# Patient Record
Sex: Male | Born: 1970
Health system: Southern US, Community
[De-identification: ages and names within clinical notes are randomized; demographics above are authoritative.]

## PROBLEM LIST (undated history)

## (undated) DIAGNOSIS — J449 Chronic obstructive pulmonary disease, unspecified: Secondary | ICD-10-CM

## (undated) DIAGNOSIS — J189 Pneumonia, unspecified organism: Secondary | ICD-10-CM

## (undated) HISTORY — PX: FINGER SURGERY: SHX640

---

## 2016-10-28 ENCOUNTER — Encounter (HOSPITAL_COMMUNITY): Payer: Self-pay | Admitting: Emergency Medicine

## 2016-10-28 ENCOUNTER — Emergency Department (HOSPITAL_COMMUNITY): Payer: Self-pay

## 2016-10-28 ENCOUNTER — Emergency Department (HOSPITAL_COMMUNITY)
Admission: EM | Admit: 2016-10-28 | Discharge: 2016-10-28 | Disposition: A | Discharge status: Home / Self Care | Payer: Self-pay | Attending: Emergency Medicine | Admitting: Emergency Medicine

## 2016-10-28 DIAGNOSIS — J439 Emphysema, unspecified: Secondary | ICD-10-CM | POA: Insufficient documentation

## 2016-10-28 DIAGNOSIS — J189 Pneumonia, unspecified organism: Secondary | ICD-10-CM | POA: Insufficient documentation

## 2016-10-28 DIAGNOSIS — R918 Other nonspecific abnormal finding of lung field: Secondary | ICD-10-CM | POA: Insufficient documentation

## 2016-10-28 DIAGNOSIS — F17210 Nicotine dependence, cigarettes, uncomplicated: Secondary | ICD-10-CM | POA: Insufficient documentation

## 2016-10-28 LAB — CBC WITH DIFFERENTIAL/PLATELET
Basophils Absolute: 0.1 10*3/uL (ref 0.0–0.1)
Basophils Relative: 1 %
EOS PCT: 4 %
Eosinophils Absolute: 0.2 10*3/uL (ref 0.0–0.7)
HEMATOCRIT: 43.3 % (ref 39.0–52.0)
Hemoglobin: 14.3 g/dL (ref 13.0–17.0)
Lymphocytes Relative: 13 %
Lymphs Abs: 0.8 10*3/uL (ref 0.7–4.0)
MCH: 23.2 pg — ABNORMAL LOW (ref 26.0–34.0)
MCHC: 33 g/dL (ref 30.0–36.0)
MCV: 70.3 fL — AB (ref 78.0–100.0)
Monocytes Absolute: 0.3 10*3/uL (ref 0.1–1.0)
Monocytes Relative: 5 %
NEUTROS PCT: 77 %
Neutro Abs: 4.4 10*3/uL (ref 1.7–7.7)
PLATELETS: 231 10*3/uL (ref 150–400)
RBC: 6.16 MIL/uL — AB (ref 4.22–5.81)
RDW: 15 % (ref 11.5–15.5)
WBC: 5.8 10*3/uL (ref 4.0–10.5)

## 2016-10-28 LAB — BASIC METABOLIC PANEL
Anion gap: 5 (ref 5–15)
BUN: 10 mg/dL (ref 6–20)
CHLORIDE: 107 mmol/L (ref 101–111)
CO2: 26 mmol/L (ref 22–32)
Calcium: 9.3 mg/dL (ref 8.9–10.3)
Creatinine, Ser: 1.23 mg/dL (ref 0.61–1.24)
GFR calc non Af Amer: >60 mL/min (ref >60)
Glucose, Bld: 71 mg/dL (ref 65–99)
POTASSIUM: 4.4 mmol/L (ref 3.5–5.1)
SODIUM: 138 mmol/L (ref 135–145)

## 2016-10-28 LAB — I-STAT CHEM 8, ED
BUN: 13 mg/dL (ref 6–20)
CHLORIDE: 104 mmol/L (ref 101–111)
Calcium, Ion: 1.16 mmol/L (ref 1.15–1.40)
Creatinine, Ser: 1.3 mg/dL — ABNORMAL HIGH (ref 0.61–1.24)
GLUCOSE: 77 mg/dL (ref 65–99)
HCT: 44 % (ref 39.0–52.0)
Hemoglobin: 15 g/dL (ref 13.0–17.0)
POTASSIUM: 4.4 mmol/L (ref 3.5–5.1)
Sodium: 140 mmol/L (ref 135–145)
TCO2: 27 mmol/L (ref 0–100)

## 2016-10-28 LAB — RAPID HIV SCREEN (HIV 1/2 AB+AG)
HIV 1/2 Antibodies: NONREACTIVE
HIV-1 P24 ANTIGEN - HIV24: NONREACTIVE

## 2016-10-28 MED ORDER — PREDNISONE 20 MG PO TABS
60.0000 mg | ORAL_TABLET | Freq: Once | ORAL | Status: AC
  Administered 2016-10-28: 60 mg via ORAL
  Filled 2016-10-28: qty 3

## 2016-10-28 MED ORDER — ALBUTEROL SULFATE HFA 108 (90 BASE) MCG/ACT IN AERS
1.0000 | INHALATION_SPRAY | Freq: Four times a day (QID) | RESPIRATORY_TRACT | 0 refills | Status: DC | PRN
Start: 2016-10-28 — End: 2017-01-16

## 2016-10-28 MED ORDER — ALBUTEROL SULFATE (2.5 MG/3ML) 0.083% IN NEBU
2.5000 mg | INHALATION_SOLUTION | Freq: Once | RESPIRATORY_TRACT | Status: AC
  Administered 2016-10-28: 2.5 mg via RESPIRATORY_TRACT
  Filled 2016-10-28: qty 3

## 2016-10-28 MED ORDER — IOPAMIDOL (ISOVUE-300) INJECTION 61%
INTRAVENOUS | Status: AC
  Administered 2016-10-28: 75 mL
  Filled 2016-10-28: qty 75

## 2016-10-28 MED ORDER — IPRATROPIUM BROMIDE 0.02 % IN SOLN
0.5000 mg | Freq: Once | RESPIRATORY_TRACT | Status: AC
  Administered 2016-10-28: 0.5 mg via RESPIRATORY_TRACT
  Filled 2016-10-28: qty 2.5

## 2016-10-28 MED ORDER — PREDNISONE 20 MG PO TABS: 40.0000 mg | ORAL_TABLET | Freq: Every day | ORAL | 0 refills | Status: DC

## 2016-10-28 MED ORDER — AEROCHAMBER PLUS W/MASK MISC
Status: AC
  Filled 2016-10-28: qty 1

## 2016-10-28 MED ORDER — ALBUTEROL SULFATE HFA 108 (90 BASE) MCG/ACT IN AERS
INHALATION_SPRAY | RESPIRATORY_TRACT | Status: AC
  Filled 2016-10-28: qty 6.7

## 2016-10-28 MED ORDER — AZITHROMYCIN 250 MG PO TABS: 250.0000 mg | ORAL_TABLET | Freq: Every day | ORAL | 0 refills | Status: DC

## 2016-10-28 MED ORDER — OPTICHAMBER DIAMOND-MD MASK MISC: 1.0000 | Freq: Once | 0 refills | Status: DC | PRN

## 2016-10-28 MED ORDER — SODIUM CHLORIDE 0.9 % IV BOLUS (SEPSIS)
1000.0000 mL | Freq: Once | INTRAVENOUS | Status: AC
  Administered 2016-10-28: 1000 mL via INTRAVENOUS

## 2016-10-28 NOTE — Discharge Instructions (Signed)
Your labwork today is normal.  Your chest xray showed likely pneumonia.  We performed a chest CT for further evaluation and this showed a combination of pneumonia, emphysema, masslike densities, and adenopathy.  This needs to be further evaluated by the pulmonologist.  Please call Dr. Kavin LeechByrum's office to schedule an appointment.  Start taking Azithromycin, prednisone, and use the albuterol inhaler for shortness of breath.  Return to the ED for any new or worsening symptoms.

## 2016-10-28 NOTE — ED Notes (Signed)
Returned from ct scan 

## 2016-10-28 NOTE — ED Notes (Signed)
Patient transported to X-ray 

## 2016-10-28 NOTE — ED Provider Notes (Signed)
[facility]-EMERGENCY DEPT Provider Note   CSN: [id number] Arrival date & time: 10/28/16  7829     History   Chief Complaint Chief Complaint  Patient presents with  . Shortness of Breath    HPI Devin Lopez is a 46 y.o. male.  HPI Devin Lopez is a 0.5 ppd with 13.5 pack year history 46 y.o. male who presents with 2-3 week history of shortness of breath with associated cough and wheezing.  No chest pain, fever, chills, myalgias, diaphoresis, N/V, or abdominal pain.  No hx of DVT/PE, unilateral edema, lower extremity edema, recent travel, immobilization, or surgery.  Nothing makes his symptoms better.  It seems worse when he walks.  He currently denies any shortness of breath.  He has not tried anything for his symptoms.  He states he had pneumonia about 1 year ago and has had issues with his breathing ever since.  He states when this normally happens he goes to the ED and receives a breathing treatment.   History reviewed. No pertinent past medical history.  There are no active problems to display for this patient.   History reviewed. No pertinent surgical history.     Home Medications    Prior to Admission medications   Medication Sig Start Date End Date Taking? Authorizing Provider  albuterol (PROVENTIL HFA;VENTOLIN HFA) 108 (90 Base) MCG/ACT inhaler Inhale 1-2 puffs into the lungs every 6 (six) hours as needed for wheezing or shortness of breath. 10/28/16   Cheri Fowler, PA-C  azithromycin (ZITHROMAX) 250 MG tablet Take 1 tablet (250 mg total) by mouth daily. Take first 2 tablets together, then 1 every day until finished. 10/28/16   Cheri Fowler, PA-C  predniSONE (DELTASONE) 20 MG tablet Take 2 tablets (40 mg total) by mouth daily. 10/28/16   Cheri Fowler, PA-C  Spacer/Aero-Holding Chambers (OPTICHAMBER DIAMOND-MD MASK) MISC 1 Device by Does not apply route Once PRN. 10/28/16   Cheri Fowler, PA-C    Family History No family history on file.  Social History Social History  Substance Use  Topics  . Smoking status: Current Every Day Smoker    Packs/day: 0.50    Types: Cigarettes  . Smokeless tobacco: Never Used  . Alcohol use No     Allergies   Patient has no known allergies.   Review of Systems Review of Systems All other systems negative unless otherwise stated in HPI   Physical Exam Updated Vital Signs BP 124/85   Pulse 65   Temp 97.6 F (36.4 C) (Oral)   Resp 23   Ht 5\' 5"  (1.651 m)   Wt 61.2 kg   SpO2 98%   BMI 22.47 kg/m   Physical Exam  Constitutional: He is oriented to person, place, and time. He appears well-developed and well-nourished. He is active.  Non-toxic appearance. He does not have a sickly appearance. He does not appear ill.  HENT:  Head: Normocephalic and atraumatic.  Right Ear: Tympanic membrane and external ear normal. Tympanic membrane is not erythematous and not bulging.  Left Ear: Tympanic membrane and external ear normal. Tympanic membrane is not erythematous and not bulging.  Nose: Nose normal.  Mouth/Throat: Uvula is midline, oropharynx is clear and moist and mucous membranes are normal. No trismus in the jaw. No uvula swelling. No oropharyngeal exudate, posterior oropharyngeal edema, posterior oropharyngeal erythema or tonsillar abscesses.  Neck: Normal range of motion. Neck supple.  No nuchal rigidity.   Cardiovascular: Normal rate and regular rhythm.   Pulmonary/Chest: Effort normal. No accessory muscle  usage. No respiratory distress. He has no decreased breath sounds. He has wheezes in the right lower field and the left lower field. He has no rales.  Abdominal: Soft. Bowel sounds are normal. He exhibits no distension. There is no tenderness.  Musculoskeletal: Normal range of motion.  Lymphadenopathy:    He has no cervical adenopathy.  Neurological: He is alert and oriented to person, place, and time.  Skin: Skin is warm and dry.  Psychiatric: He has a normal mood and affect. His behavior is normal.     ED Treatments /  Results  Labs (all labs ordered are listed, but only abnormal results are displayed) Labs Reviewed  CBC WITH DIFFERENTIAL/PLATELET - Abnormal; Notable for the following:       Result Value   RBC 6.16 (*)    MCV 70.3 (*)    MCH 23.2 (*)    All other components within normal limits  I-STAT CHEM 8, ED - Abnormal; Notable for the following:    Creatinine, Ser 1.30 (*)    All other components within normal limits  BASIC METABOLIC PANEL  RAPID HIV SCREEN (HIV 1/2 AB+AG)    EKG  EKG Interpretation None       Radiology Dg Chest 2 View  Result Date: 10/28/2016 CLINICAL DATA:  Shortness of breath and nonproductive cough. EXAM: CHEST  2 VIEW COMPARISON:  None. FINDINGS: The patient has extensive patchy bilateral upper lobe and right middle lobe infiltrates with scarring. The heart size is normal. Possible superior mediastinal adenopathy. The lungs are hyperinflated consistent with emphysema. No acute bone abnormality. IMPRESSION: Bilateral upper lobe and right middle lobe pulmonary infiltrates. Is the patient immunocompromised? Possibility of tuberculosis should be considered as well as other atypical diseases. CT scan of the chest with contrast recommended for further characterization. Electronically Signed   By: Francene Boyers M.D.   On: 10/28/2016 10:15   Ct Chest W Contrast  Result Date: 10/28/2016 CLINICAL DATA:  Shortness of breath and cough for 2 weeks. EXAM: CT CHEST WITH CONTRAST TECHNIQUE: Multidetector CT imaging of the chest was performed during intravenous contrast administration. CONTRAST:  75mL ISOVUE-300 IOPAMIDOL (ISOVUE-300) INJECTION 61% COMPARISON:  Chest x-ray 10/28/2016 FINDINGS: Cardiovascular: Heart size is normal. No imaged pericardial effusion or significant coronary artery calcifications. Thoracic aorta is normal in appearance. Pulmonary arteries are normal in size. Mediastinum/Nodes: The visualized portion of the thyroid gland has a normal appearance. Pretracheal lymph  node is 9 mm in short axis. Bilateral hilar adenopathy is suspected but is confluent with parenchymal abnormalities bilaterally. Esophagus is normal in appearance. Lungs/Pleura: There is marked abnormality of the lungs. Significant paraseptal emphysematous changes are present. There are spiculated masslike densities throughout the lungs bilaterally with an upper lobe predominance. Somewhat confluent appearance of masses in the upper lobe regions with a peri-bronchial distribution. There is cephalization of the hilar regions bilaterally. No significant pleural effusions. Upper Abdomen: Probable perfusion abnormalities within the left hepatic lobe. Gallbladder is present. Musculoskeletal: No chest wall abnormality. No acute or significant osseous findings. IMPRESSION: 1. Marked pulmonary abnormalities. There is a combination of emphysema, masslike densities, and adenopathy. 2. Considerations include silicosis, sarcoidosis, coal workers or other pneumoconiosis, Wegner's granulomatosis, or COPD and superimposed infectious/inflammatory process. Malignancy cannot be excluded given the masslike appearance of the pulmonary masses and superimposed adenopathy. However, malignancy is felt to be less likely. Electronically Signed   By: Norva Pavlov M.D.   On: 10/28/2016 12:30    Procedures Procedures (including critical care time)  Medications  Ordered in ED Medications  albuterol (PROVENTIL HFA;VENTOLIN HFA) 108 (90 Base) MCG/ACT inhaler (not administered)  aerochamber plus with mask device (not administered)  albuterol (PROVENTIL) (2.5 MG/3ML) 0.083% nebulizer solution 2.5 mg (2.5 mg Nebulization Given 10/28/16 0956)  ipratropium (ATROVENT) nebulizer solution 0.5 mg (0.5 mg Nebulization Given 10/28/16 0956)  predniSONE (DELTASONE) tablet 60 mg (60 mg Oral Given 10/28/16 0956)  sodium chloride 0.9 % bolus 1,000 mL (0 mLs Intravenous Stopped 10/28/16 1329)  iopamidol (ISOVUE-300) 61 % injection (75 mLs  Contrast Given  10/28/16 1138)     Initial Impression / Assessment and Plan / ED Course  I have reviewed the triage vital signs and the nursing notes.  Pertinent labs & imaging results that were available during my care of the patient were reviewed by me and considered in my medical decision making (see chart for details).  Clinical Course    Smoker presents with shortness of breath x 2-3 weeks.  Hx of same that improve with breathing treatments.  No s/sx of DVT or PE.  Patient feels improved after duoneb.  Vitals have been stable throughout ED stay.  Received PO prednisone as well.  Chest xray showed b/l upper lobe and RML infiltrates and concern for TB.  Rapid HIV negative.  CT chest obtained for further evaluation and showed marked pulmonary abnormalities including emphysema, masslike densities, and adenopathy.  Discussed this with the patient and he will follow up with pulmonology for further evaluation.  Discharge home with prednisone, albuterol, and azithromycin for CAP.  Return precautions discussed.  Stable for discharge.  Case has been discussed with Dr. Fredderick PhenixBelfi who agrees with the above plan for discharge.    Final Clinical Impressions(s) / ED Diagnoses   Final diagnoses:  Pulmonary emphysema, unspecified emphysema type (HCC)  Pulmonary mass  Community acquired pneumonia, unspecified laterality    New Prescriptions Discharge Medication List as of 10/28/2016  2:25 PM    START taking these medications   Details  albuterol (PROVENTIL HFA;VENTOLIN HFA) 108 (90 Base) MCG/ACT inhaler Inhale 1-2 puffs into the lungs every 6 (six) hours as needed for wheezing or shortness of breath., Starting Mon 10/28/2016, Print    azithromycin (ZITHROMAX) 250 MG tablet Take 1 tablet (250 mg total) by mouth daily. Take first 2 tablets together, then 1 every day until finished., Starting Mon 10/28/2016, Print    predniSONE (DELTASONE) 20 MG tablet Take 2 tablets (40 mg total) by mouth daily., Starting Mon 10/28/2016, Print      Spacer/Aero-Holding Chambers (OPTICHAMBER DIAMOND-MD MASK) MISC 1 Device by Does not apply route Once PRN., Starting Mon 10/28/2016, Print         Cheri FowlerKayla Havier Deeb, PA-C 10/28/16 1548    Rolan BuccoMelanie Belfi, MD 10/28/16 [phone]

## 2016-10-28 NOTE — ED Triage Notes (Addendum)
Pt in from home with c/o sob x 2-3 wks. Pt states hx of PNA 1 yr ago, "hasn't been the same since". Pt denies fevers, endorses cough without sputum. Pt states he "feels like he can't get a deep breath". Smokes 1/2 PPD. Chest rise symmetrical, sats 100% on RA

## 2016-11-27 ENCOUNTER — Inpatient Hospital Stay: Payer: Self-pay | Admitting: Pulmonary Disease

## 2016-12-06 ENCOUNTER — Encounter: Payer: Self-pay | Admitting: Pulmonary Disease

## 2016-12-06 ENCOUNTER — Other Ambulatory Visit (INDEPENDENT_AMBULATORY_CARE_PROVIDER_SITE_OTHER): Payer: Self-pay

## 2016-12-06 ENCOUNTER — Ambulatory Visit (INDEPENDENT_AMBULATORY_CARE_PROVIDER_SITE_OTHER): Payer: Self-pay | Admitting: Pulmonary Disease

## 2016-12-06 VITALS — BP 126/70 | HR 84 | Ht 65.0 in | Wt 127.6 lb

## 2016-12-06 DIAGNOSIS — J849 Interstitial pulmonary disease, unspecified: Secondary | ICD-10-CM

## 2016-12-06 DIAGNOSIS — F17210 Nicotine dependence, cigarettes, uncomplicated: Secondary | ICD-10-CM

## 2016-12-06 LAB — SEDIMENTATION RATE: Sed Rate: 6 mm/hr (ref 0–15)

## 2016-12-06 MED ORDER — BUDESONIDE-FORMOTEROL FUMARATE 160-4.5 MCG/ACT IN AERO: 2.0000 | INHALATION_SPRAY | Freq: Two times a day (BID) | RESPIRATORY_TRACT | 5 refills | Status: DC

## 2016-12-06 NOTE — Progress Notes (Addendum)
Devin Lopez    [id number]    07/16/1971  Primary Care Physician:No PCP Per Patient  Referring Physician: No referring provider defined for this encounter.  Chief complaint:  Consult for evaluation of dyspnea  HPI: Devin Lopez is a 46 year old with no significant past medical history. [facility] [place]. He was evaluated in the ED in early January for dyspnea. He had a chest x-ray which showed upper lobe opacities and a subsequent CT which showed findings including emphysematous changes, bilateral masslike densities with mediastinal adenopathy. He was treated with prednisone, nebulizer and azithromycin.  He is here in office today with persistent symptoms of dyspnea on exertion with wheezing. He denies any cough, sputum production, fevers, chills. He has a 25-pack-year smoking history and continues to smoke half pack per day. 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 He does not report any exposure to asbestos.   Outpatient Encounter Prescriptions as of 12/06/2016  Medication Sig  . albuterol (PROVENTIL HFA;VENTOLIN HFA) 108 (90 Base) MCG/ACT inhaler Inhale 1-2 puffs into the lungs every 6 (six) hours as needed for wheezing or shortness of breath.  . Spacer/Aero-Holding Chambers (OPTICHAMBER DIAMOND-MD MASK) MISC 1 Device by Does not apply route Once PRN.  . [DISCONTINUED] azithromycin (ZITHROMAX) 250 MG tablet Take 1 tablet (250 mg total) by mouth daily. Take first 2 tablets together, then 1 every day until finished.  . [DISCONTINUED] predniSONE (DELTASONE) 20 MG tablet Take 2 tablets (40 mg total) by mouth daily.   No facility-administered encounter medications on file as of 12/06/2016.     Allergies as of 12/06/2016  . (No Known Allergies)    History reviewed. No pertinent past medical history.  Past Surgical History:  Procedure Laterality Date  .  FINGER SURGERY      Family History  Problem Relation Age of Onset  . Diabetes Mother   . Aneurysm Mother   . Diabetes Maternal Grandmother     Social History   Social History  . Marital status: Single    Spouse name: N/A  . Number of children: N/A  . Years of education: N/A   Occupational History  . Not on file.   Social History Main Topics  . Smoking status: Current Every Day Smoker    Packs/day: 0.50    Types: Cigarettes  . Smokeless tobacco: Never Used  . Alcohol use No  . Drug use: No  . Sexual activity: Not on file   Other Topics Concern  . Not on file   Social History Narrative  . No narrative on file    Review of systems: Review of Systems  Constitutional: Negative for fever and chills.  HENT: Negative.   Eyes: Negative for blurred vision.  Respiratory: as per HPI  Cardiovascular: Negative for chest pain and palpitations.  Gastrointestinal: Negative for vomiting, diarrhea, blood per rectum. Genitourinary: Negative for dysuria, urgency, frequency and hematuria.  Musculoskeletal: Negative for myalgias, back pain and joint pain.  Skin: Negative for itching and rash.  Neurological: Negative for dizziness, tremors, focal weakness, seizures and loss of consciousness.  Endo/Heme/Allergies: Negative for environmental allergies.  Psychiatric/Behavioral: Negative for depression, suicidal ideas and hallucinations.  All other systems reviewed and are negative.  Physical Exam: Blood pressure 126/70, pulse 84, height 5\' 5"  (1.651 m), weight 127 lb 9.6 oz (57.9 kg), SpO2 97 %. Gen:  No acute distress HEENT:  EOMI, sclera anicteric Neck:     No masses; no thyromegaly Lungs:    Clear to auscultation bilaterally; normal respiratory effort CV:         Regular rate and rhythm; no murmurs Abd:      + bowel sounds; soft, non-tender; no palpable masses, no distension Ext:    No edema; adequate peripheral perfusion Skin:      Warm and dry; no rash Neuro: alert and  oriented x 3 Psych: normal mood and affect  Data Reviewed: Chest x-ray 10/28/16- bilateral upper lobe and middle lobe pulmonary infiltrates CT scan 10/28/16- significant emphysematous changes. Masslike densities bilaterally in the upper lobe, paratracheal, hilar lymphadenopathy. I have reviewed all images personally   Assessment:  Consult for dyspnea Abnormal CT scan Devin Lopez likely has emphysema with superimposed masslike opacities, adenopathy on CT scan. This is suspicious for an inflammatory process such as sarcoidosis, autoimmune process or exposures related to pneumoconiosis given his work history. Malignancy is on the differential but is CT scan findings are not very typical of this.  I will send off serologies for autoimmune disease, check ACE levels, Alpha1 antitrypsin levels and pulmonary function tests. We'll follow up the repeat CT scan of the chest. We may need a bronchoscope biopsy but I would defer this procedure for now as he is at higher risk for pneumothorax given his underlying lung disease.  I'll start him on Symbicort to help with his symptoms of dyspnea and wheezing.  Active smoker I have advised smoking cessation but he wants to try to quit on his own. Time spent counseling- 5 mins  Plan/Recommendations: - Autoimmune panel, PFTs, ACE level,A1AT level - Schedule PFTs and repeat CT of chest - Start symbicort - Smoking cessation  Chilton Greathouse MD [facility] Pager [phone] 12/06/2016, 11:09 AM  CC: No ref. provider found

## 2016-12-06 NOTE — Patient Instructions (Addendum)
We'll check blood work for you including ANA with reflex panel, ANCA panel, Sed rate, CCP, rheumatoid factor Check A1AT levels and phenotype. You will be scheduled for PFTs Start symbicort 160/4.5  You will need a repeat high res CT in 6 months Follow up after CT scan

## 2016-12-07 LAB — ANA W/REFLEX: Anti Nuclear Antibody(ANA): NEGATIVE

## 2016-12-09 LAB — CYCLIC CITRUL PEPTIDE ANTIBODY, IGG: Cyclic Citrullin Peptide Ab: <16 Units

## 2016-12-09 LAB — RHEUMATOID FACTOR: Rhuematoid fact SerPl-aCnc: <14 IU/mL (ref <14)

## 2016-12-09 LAB — ANCA SCREEN W REFLEX TITER: ANCA Screen: NEGATIVE

## 2016-12-09 LAB — ANGIOTENSIN CONVERTING ENZYME: ANGIOTENSIN-CONVERTING ENZYME: 120 U/L — AB (ref 9–67)

## 2016-12-11 LAB — ALPHA-1 ANTITRYPSIN PHENOTYPE: A-1 Antitrypsin: 161 mg/dL (ref 83–199)

## 2017-01-12 ENCOUNTER — Emergency Department (HOSPITAL_COMMUNITY)
Admission: EM | Admit: 2017-01-12 | Discharge: 2017-01-12 | Disposition: A | Discharge status: Home / Self Care | Payer: Self-pay | Attending: Emergency Medicine | Admitting: Emergency Medicine

## 2017-01-12 ENCOUNTER — Emergency Department (HOSPITAL_COMMUNITY): Payer: Self-pay

## 2017-01-12 ENCOUNTER — Encounter (HOSPITAL_COMMUNITY): Payer: Self-pay | Admitting: Emergency Medicine

## 2017-01-12 DIAGNOSIS — F17210 Nicotine dependence, cigarettes, uncomplicated: Secondary | ICD-10-CM | POA: Insufficient documentation

## 2017-01-12 DIAGNOSIS — J181 Lobar pneumonia, unspecified organism: Secondary | ICD-10-CM | POA: Insufficient documentation

## 2017-01-12 DIAGNOSIS — J189 Pneumonia, unspecified organism: Secondary | ICD-10-CM

## 2017-01-12 HISTORY — DX: Pneumonia, unspecified organism: J18.9

## 2017-01-12 MED ORDER — DOXYCYCLINE HYCLATE 100 MG PO CAPS: 100.0000 mg | ORAL_CAPSULE | Freq: Two times a day (BID) | ORAL | 0 refills | Status: DC

## 2017-01-12 MED ORDER — IPRATROPIUM-ALBUTEROL 0.5-2.5 (3) MG/3ML IN SOLN
3.0000 mL | Freq: Once | RESPIRATORY_TRACT | Status: AC
  Administered 2017-01-12: 3 mL via RESPIRATORY_TRACT
  Filled 2017-01-12: qty 3

## 2017-01-12 MED ORDER — ALBUTEROL SULFATE (2.5 MG/3ML) 0.083% IN NEBU
5.0000 mg | INHALATION_SOLUTION | Freq: Once | RESPIRATORY_TRACT | Status: AC
  Administered 2017-01-12: 5 mg via RESPIRATORY_TRACT

## 2017-01-12 MED ORDER — IPRATROPIUM-ALBUTEROL 0.5-2.5 (3) MG/3ML IN SOLN: 3.0000 mL | RESPIRATORY_TRACT | Status: DC

## 2017-01-12 MED ORDER — ALBUTEROL SULFATE (2.5 MG/3ML) 0.083% IN NEBU
INHALATION_SOLUTION | RESPIRATORY_TRACT | Status: AC
  Filled 2017-01-12: qty 6

## 2017-01-12 NOTE — ED Notes (Signed)
Declined W/C at D/C and was escorted to lobby by RN. 

## 2017-01-12 NOTE — Discharge Instructions (Signed)
You have been diagnosed with pneumonia, an infection in your lungs. You will be treated for this with antibiotics, doxycycline. It is important to Take all of your antibiotics as prescribed, do not save or share them. Follow-up with your doctor. Return to ED for new or worsening symptoms as we discussed.

## 2017-01-12 NOTE — ED Triage Notes (Signed)
Pt c/o cough and shortness of breath x 2 days. Pt with expiratory wheezing in triage.

## 2017-01-12 NOTE — ED Provider Notes (Signed)
[facility] DEPT Provider Note   CSN: [id number] Arrival date & time: 01/12/17  0730  By signing my name below, I, Linna DarnerRussell Turner, attest that this documentation has been prepared under the direction and in the presence of General MillsBenjamin Charese Abundis, PA-C. Electronically Signed: Linna Darnerussell Turner, Scribe. 01/12/2017. 9:13 AM.  History   Chief Complaint Chief Complaint  Patient presents with  . Cough  . Shortness of Breath    The history is provided by the patient. No language interpreter was used.     HPI Comments: Devin Lopez is a 46 y.o. male with PMHx of pneumonia who presents to the Emergency Department complaining of a persistent cough beginning three days ago. He reports associated nasal congestion, subjective fever/chills, and SOB secondary to his cough. He received a breathing treatment here with good improvement of his cough and SOB. Pt has used Nyquil and TheraFlu at home with mild improvement of his symptoms. He also has an inhaler at home and last used it this morning with no improvement of his cough/SOB. No h/o DM or other immunocompromised conditions. Denies recent antibiotic use. Pt denies chest pain, sore throat, rhinorrhea, or any other associated symptoms. He states he is followed by a pulmonologist.  Past Medical History:  Diagnosis Date  . Pneumonia     There are no active problems to display for this patient.   Past Surgical History:  Procedure Laterality Date  . FINGER SURGERY        Home Medications    Prior to Admission medications   Medication Sig Start Date End Date Taking? Authorizing Provider  albuterol (PROVENTIL HFA;VENTOLIN HFA) 108 (90 Base) MCG/ACT inhaler Inhale 1-2 puffs into the lungs every 6 (six) hours as needed for wheezing or shortness of breath. 10/28/16   Cheri FowlerKayla Rose, PA-C  budesonide-formoterol (SYMBICORT) 160-4.5 MCG/ACT inhaler Inhale 2 puffs into the lungs 2 (two) times daily. 12/06/16 12/07/16  Chilton GreathousePraveen Mannam, MD  doxycycline (VIBRAMYCIN)  100 MG capsule Take 1 capsule (100 mg total) by mouth 2 (two) times daily. One po bid x 7 days 01/12/17   Joycie PeekBenjamin Izabel Chim, PA-C  Spacer/Aero-Holding Chambers (OPTICHAMBER DIAMOND-MD MASK) MISC 1 Device by Does not apply route Once PRN. 10/28/16   Cheri FowlerKayla Rose, PA-C    Family History Family History  Problem Relation Age of Onset  . Diabetes Mother   . Aneurysm Mother   . Diabetes Maternal Grandmother     Social History Social History  Substance Use Topics  . Smoking status: Current Every Day Smoker    Packs/day: 0.50    Types: Cigarettes  . Smokeless tobacco: Never Used  . Alcohol use No     Allergies   Patient has no known allergies.   Review of Systems Review of Systems  A complete review of systems was obtained and all systems are negative except as noted in the HPI and PMH.   Physical Exam Updated Vital Signs BP 132/85 (BP Location: Right Arm)   Pulse (!) 103   Temp 98 F (36.7 C) (Oral)   Resp 20   Ht 5\' 5"  (1.651 m)   Wt 59 kg   SpO2 98%   BMI 21.63 kg/m   Physical Exam  Constitutional: He is oriented to person, place, and time. He appears well-developed and well-nourished. No distress.  HENT:  Head: Normocephalic and atraumatic.  Eyes: Conjunctivae and EOM are normal.  Neck: Neck supple. No tracheal deviation present.  Cardiovascular: Normal rate.   Pulmonary/Chest: Effort normal. No respiratory distress.  Minimally expiratory  wheezing diffusely. No other obvious adventitious lung sounds. No tachypnea  Musculoskeletal: Normal range of motion.  Neurological: He is alert and oriented to person, place, and time.  Skin: Skin is warm and dry.  Psychiatric: He has a normal mood and affect. His behavior is normal.  Nursing note and vitals reviewed.  Vitals:   01/12/17 0733 01/12/17 0738 01/12/17 0847 01/12/17 0929  BP: (!) 136/98   132/85  Pulse: 85  100 (!) 103  Resp: (!) 22   20  Temp: 98.6 F (37 C)   98 F (36.7 C)  TempSrc: Oral   Oral  SpO2: 99%   98% 98%  Weight:  59 kg    Height:  5\' 5"  (1.651 m)       ED Treatments / Results  Labs (all labs ordered are listed, but only abnormal results are displayed) Labs Reviewed - No data to display  EKG  EKG Interpretation None       Radiology Dg Chest 2 View  Result Date: 01/12/2017 CLINICAL DATA:  Shortness of breath.  Productive cough.  Pneumonia. EXAM: CHEST  2 VIEW COMPARISON:  10/28/2016 FINDINGS: Heart size is normal. Extensive chronic lung disease with advanced emphysema and scarring throughout the lungs. Allowing for projectional differences, density is more pronounced in the right midlung, were there is probably some superimposed acute infiltrate in the region of chronic disease. No pleural effusion. No acute bone finding. IMPRESSION: Advanced chronic lung disease. Density is more pronounced in the right mid lung, probably indicating some superimposed acute pneumonia. Electronically Signed   By: Paulina Fusi M.D.   On: 01/12/2017 08:22    Procedures Procedures (including critical care time)  DIAGNOSTIC STUDIES: Oxygen Saturation is 99% on RA, normal by my interpretation.    COORDINATION OF CARE: 9:17 AM Discussed treatment plan with pt at bedside and pt agreed to plan.  Medications Ordered in ED Medications  albuterol (PROVENTIL) (2.5 MG/3ML) 0.083% nebulizer solution (not administered)  ipratropium-albuterol (DUONEB) 0.5-2.5 (3) MG/3ML nebulizer solution 3 mL (3 mLs Nebulization Not Given 01/12/17 0931)  albuterol (PROVENTIL) (2.5 MG/3ML) 0.083% nebulizer solution 5 mg (5 mg Nebulization Given 01/12/17 0743)  ipratropium-albuterol (DUONEB) 0.5-2.5 (3) MG/3ML nebulizer solution 3 mL (3 mLs Nebulization Given 01/12/17 0917)     Initial Impression / Assessment and Plan / ED Course  I have reviewed the triage vital signs and the nursing notes.  Pertinent labs & imaging results that were available during my care of the patient were reviewed by me and considered in my  medical decision making (see chart for details).     Patient feels much better after DuoNeb breathing treatment. Exam is reassuring. X-ray shows likely superimposed acute pneumonia. Treated empirically with doxycycline. Encouraged continued use of OTC cough medications, inhaler at home and follow-up with PCP. Return precautions discussed. He verbalizes understanding, agrees with plan and subsequent discharge.  Final Clinical Impressions(s) / ED Diagnoses   Final diagnoses:  Community acquired pneumonia of right middle lobe of lung (HCC)   New Prescriptions New Prescriptions   DOXYCYCLINE (VIBRAMYCIN) 100 MG CAPSULE    Take 1 capsule (100 mg total) by mouth 2 (two) times daily. One po bid x 7 days    I personally performed the services described in this documentation, which was scribed in my presence. The recorded information has been reviewed and is accurate.    [name], PA-C 01/12/17 0940    Mancel Bale, MD 01/12/17 1640

## 2017-01-16 ENCOUNTER — Emergency Department (HOSPITAL_COMMUNITY): Payer: Self-pay

## 2017-01-16 ENCOUNTER — Encounter (HOSPITAL_COMMUNITY): Payer: Self-pay | Admitting: Emergency Medicine

## 2017-01-16 ENCOUNTER — Emergency Department (HOSPITAL_COMMUNITY)
Admission: EM | Admit: 2017-01-16 | Discharge: 2017-01-16 | Disposition: A | Discharge status: Home / Self Care | Payer: Self-pay | Attending: Emergency Medicine | Admitting: Emergency Medicine

## 2017-01-16 DIAGNOSIS — J849 Interstitial pulmonary disease, unspecified: Secondary | ICD-10-CM | POA: Insufficient documentation

## 2017-01-16 DIAGNOSIS — R0602 Shortness of breath: Secondary | ICD-10-CM

## 2017-01-16 DIAGNOSIS — F17210 Nicotine dependence, cigarettes, uncomplicated: Secondary | ICD-10-CM | POA: Insufficient documentation

## 2017-01-16 LAB — BASIC METABOLIC PANEL
ANION GAP: 13 (ref 5–15)
BUN: 10 mg/dL (ref 6–20)
CO2: 23 mmol/L (ref 22–32)
Calcium: 9.2 mg/dL (ref 8.9–10.3)
Chloride: 101 mmol/L (ref 101–111)
Creatinine, Ser: 1.23 mg/dL (ref 0.61–1.24)
GFR calc Af Amer: >60 mL/min (ref >60)
GFR calc non Af Amer: >60 mL/min (ref >60)
GLUCOSE: 128 mg/dL — AB (ref 65–99)
POTASSIUM: 4.1 mmol/L (ref 3.5–5.1)
Sodium: 137 mmol/L (ref 135–145)

## 2017-01-16 LAB — CBC
HCT: 40.3 % (ref 39.0–52.0)
Hemoglobin: 13.6 g/dL (ref 13.0–17.0)
MCH: 23.3 pg — ABNORMAL LOW (ref 26.0–34.0)
MCHC: 33.7 g/dL (ref 30.0–36.0)
MCV: 69.1 fL — AB (ref 78.0–100.0)
Platelets: 216 10*3/uL (ref 150–400)
RBC: 5.83 MIL/uL — AB (ref 4.22–5.81)
RDW: 14.5 % (ref 11.5–15.5)
WBC: 11.3 10*3/uL — AB (ref 4.0–10.5)

## 2017-01-16 MED ORDER — PREDNISONE 10 MG PO TABS: 60.0000 mg | ORAL_TABLET | Freq: Every day | ORAL | 0 refills | Status: DC

## 2017-01-16 MED ORDER — BUDESONIDE-FORMOTEROL FUMARATE 160-4.5 MCG/ACT IN AERO: 2.0000 | INHALATION_SPRAY | Freq: Two times a day (BID) | RESPIRATORY_TRACT | 5 refills | Status: DC

## 2017-01-16 MED ORDER — ALBUTEROL SULFATE HFA 108 (90 BASE) MCG/ACT IN AERS
2.0000 | INHALATION_SPRAY | RESPIRATORY_TRACT | Status: DC
  Administered 2017-01-16: 2 via RESPIRATORY_TRACT
  Filled 2017-01-16: qty 6.7

## 2017-01-16 MED ORDER — ALBUTEROL SULFATE HFA 108 (90 BASE) MCG/ACT IN AERS: 1.0000 | INHALATION_SPRAY | Freq: Four times a day (QID) | RESPIRATORY_TRACT | 0 refills | Status: DC | PRN

## 2017-01-16 NOTE — ED Notes (Signed)
Patient transported to X-ray 

## 2017-01-16 NOTE — ED Notes (Signed)
Labs collected by RN 

## 2017-01-16 NOTE — ED Triage Notes (Signed)
Per EMS.  Pt was diagnosed with Pneumonia last Sunday 01-12-17.  Pt was experiencing SOB tonight and called EMS.  Pt was given 125 of solu-medrol and 2 duo neb treatments.

## 2017-01-16 NOTE — ED Notes (Signed)
Pt back form X-Ray.

## 2017-01-16 NOTE — ED Provider Notes (Signed)
[facility] Provider Note   CSN: [id number] Arrival date & time: 01/16/17  [id number]  By signing my name below, I, Arianna Nassar, attest that this documentation has been prepared under the direction and in the presence of Azalia BilisKevin Eveline Sauve, MD.  Electronically Signed: Octavia HeirArianna Nassar, ED Scribe. 01/16/17. 1:45 AM.    History   Chief Complaint Chief Complaint  Patient presents with  . Shortness of Breath   The history is provided by the patient. No language interpreter was used.   HPI Comments: Devin Lopez is a 46 y.o. male brought in by ambulance, who presents to the Emergency Department complaining of sudden onset, moderate, shortness of breath that began ~ 4 hours ago. Pt was evaluated 01/12/17 for a persistent cough where he had imaging performed and was diagnosed with pneumonia of the right middle lobe. He was prescribed doxycycline and was given an albuterol inhaler. He expresses he was feeling much better around 1 pm this afternoon but around 10 pm his symptoms became worse. Pt received 125 of solumedrol and 2 duoneb treatments from EMS, which he expresses modified his symptoms. He denies fever. Pt is a smoker and has smoked 3 cigarettes since his initial diagnosis on Sunday.  Past Medical History:  Diagnosis Date  . Pneumonia     There are no active problems to display for this patient.   Past Surgical History:  Procedure Laterality Date  . FINGER SURGERY         Home Medications    Prior to Admission medications   Medication Sig Start Date End Date Taking? Authorizing Provider  albuterol (PROVENTIL HFA;VENTOLIN HFA) 108 (90 Base) MCG/ACT inhaler Inhale 1-2 puffs into the lungs every 6 (six) hours as needed for wheezing or shortness of breath. 10/28/16   Cheri FowlerKayla Rose, PA-C  budesonide-formoterol (SYMBICORT) 160-4.5 MCG/ACT inhaler Inhale 2 puffs into the lungs 2 (two) times daily. 12/06/16 12/07/16  Chilton GreathousePraveen Mannam, MD  doxycycline (VIBRAMYCIN) 100 MG capsule Take 1  capsule (100 mg total) by mouth 2 (two) times daily. One po bid x 7 days 01/12/17   Joycie PeekBenjamin Cartner, PA-C  Spacer/Aero-Holding Chambers (OPTICHAMBER DIAMOND-MD MASK) MISC 1 Device by Does not apply route Once PRN. 10/28/16   Cheri FowlerKayla Rose, PA-C    Family History Family History  Problem Relation Age of Onset  . Diabetes Mother   . Aneurysm Mother   . Diabetes Maternal Grandmother     Social History Social History  Substance Use Topics  . Smoking status: Current Every Day Smoker    Packs/day: 0.50    Types: Cigarettes  . Smokeless tobacco: Never Used  . Alcohol use No     Allergies   Patient has no known allergies.   Review of Systems Review of Systems  A complete 10 system review of systems was obtained and all systems are negative except as noted in the HPI and PMH.   Physical Exam Updated Vital Signs BP (!) 173/105 (BP Location: Right Arm)   Pulse 99   Temp 100 F (37.8 C) (Oral)   Resp (!) 32   Ht 5\' 8"  (1.727 m)   Wt 140 lb (63.5 kg)   SpO2 100%   BMI 21.29 kg/m   Physical Exam  Constitutional: He is oriented to person, place, and time. He appears well-developed and well-nourished.  HENT:  Head: Normocephalic and atraumatic.  Eyes: EOM are normal.  Neck: Normal range of motion.  Cardiovascular: Normal rate, regular rhythm, normal heart sounds and intact distal pulses.  Pulmonary/Chest: Effort normal and breath sounds normal. No respiratory distress.  Abdominal: Soft. He exhibits no distension. There is no tenderness.  Musculoskeletal: Normal range of motion.  Neurological: He is alert and oriented to person, place, and time.  Skin: Skin is warm and dry.  Psychiatric: He has a normal mood and affect. Judgment normal.  Nursing note and vitals reviewed.    ED Treatments / Results  DIAGNOSTIC STUDIES: Oxygen Saturation is 100% on RA, normal by my interpretation.  COORDINATION OF CARE:  1:44 AM Discussed treatment plan with pt at bedside and pt agreed to  plan.  Labs (all labs ordered are listed, but only abnormal results are displayed) Labs Reviewed  CBC - Abnormal; Notable for the following:       Result Value   WBC 11.3 (*)    RBC 5.83 (*)    MCV 69.1 (*)    MCH 23.3 (*)    All other components within normal limits  BASIC METABOLIC PANEL - Abnormal; Notable for the following:    Glucose, Bld 128 (*)    All other components within normal limits    EKG  EKG Interpretation  Date/Time:  Thursday January 16 2017 02:14:53 EDT Ventricular Rate:  79 PR Interval:    QRS Duration: 82 QT Interval:  374 QTC Calculation: 429 R Axis:   96 Text Interpretation:  Sinus rhythm LAE, consider biatrial enlargement Lateral infarct, old Anteroseptal infarct, age indeterminate No significant change was found Confirmed by Keyasha Miah  MD, Caryn Bee ([id number]) on 01/16/2017 4:33:33 AM       Radiology Dg Chest 2 View  Result Date: 01/16/2017 CLINICAL DATA:  46 year old male with shortness of breath. EXAM: CHEST  2 VIEW COMPARISON:  Chest radiograph dated 01/12/2017 FINDINGS: There is chronic interstitial coarsening with areas of fibrosis predominantly involving the upper lobes likely related to underlying conditions such as pneumoconiosis or sarcoidosis. The appearance of the lungs are similar to the prior radiograph. There is associated retraction of the hila. There is no new consolidative change. No pleural effusion or pneumothorax. Stable cardiomediastinal silhouette. No acute osseous pathology. 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 No new consolidative change noted. Electronically Signed   By: Elgie Collard M.D.   On: 01/16/2017 02:41    Procedures Procedures (including critical care time)  Medications Ordered in ED Medications  albuterol (PROVENTIL HFA;VENTOLIN HFA) 108 (90 Base) MCG/ACT inhaler 2 puff (2 puffs Inhalation Given 01/16/17 0301)      Initial Impression / Assessment and Plan / ED Course  I have reviewed the triage vital signs and the nursing notes.  Pertinent labs & imaging results that were available during my care of the patient were reviewed by me and considered in my medical decision making (see chart for details).     4:34 AM Pt feels better. Hx of ILD. Feeling better. Dc home with steroids and bronchodilators.  Pulmonary and pcp follow up.  Patient understands return to the emergency department for new or worsening symptoms.  Final Clinical Impressions(s) / ED Diagnoses   Final diagnoses:  Interstitial lung disease (HCC)  SOB (shortness of breath)   I personally performed the services described in this documentation, which was scribed in my presence. The recorded information has been reviewed and is accurate.     New Prescriptions New Prescriptions   PREDNISONE (DELTASONE) 10 MG TABLET    Take 6 tablets (60 mg total) by mouth daily.  [name], MD 01/16/17 [phone]

## 2017-01-17 MED FILL — ?PREDNISONE 10 MG TABLET: 10 | 5 days supply | Qty: 30 | Fill #0

## 2017-01-17 MED FILL — !VENTOLIN HFA INHALER: 108 (90 BAS | 25 days supply | Qty: 18 | Fill #0

## 2017-01-17 MED FILL — !SYMBICORT 160-4.5 MCG INH: 160-4.5 | 15 days supply | Qty: 1 | Fill #0

## 2017-01-31 ENCOUNTER — Ambulatory Visit: Payer: Self-pay | Attending: Internal Medicine

## 2017-02-01 ENCOUNTER — Emergency Department (HOSPITAL_COMMUNITY)
Admission: EM | Admit: 2017-02-01 | Discharge: 2017-02-01 | Disposition: A | Discharge status: Home / Self Care | Payer: Self-pay | Attending: Emergency Medicine | Admitting: Emergency Medicine

## 2017-02-01 ENCOUNTER — Encounter (HOSPITAL_COMMUNITY): Payer: Self-pay | Admitting: Emergency Medicine

## 2017-02-01 DIAGNOSIS — R062 Wheezing: Secondary | ICD-10-CM | POA: Insufficient documentation

## 2017-02-01 DIAGNOSIS — F17210 Nicotine dependence, cigarettes, uncomplicated: Secondary | ICD-10-CM | POA: Insufficient documentation

## 2017-02-01 DIAGNOSIS — R310 Gross hematuria: Secondary | ICD-10-CM | POA: Insufficient documentation

## 2017-02-01 LAB — COMPREHENSIVE METABOLIC PANEL
ALBUMIN: 3.5 g/dL (ref 3.5–5.0)
ALK PHOS: 64 U/L (ref 38–126)
ALT: 13 U/L — AB (ref 17–63)
AST: 19 U/L (ref 15–41)
Anion gap: 6 (ref 5–15)
BILIRUBIN TOTAL: 0.9 mg/dL (ref 0.3–1.2)
BUN: 12 mg/dL (ref 6–20)
CALCIUM: 8.9 mg/dL (ref 8.9–10.3)
CO2: 25 mmol/L (ref 22–32)
Chloride: 106 mmol/L (ref 101–111)
Creatinine, Ser: 1.19 mg/dL (ref 0.61–1.24)
GFR calc Af Amer: >60 mL/min (ref >60)
GFR calc non Af Amer: >60 mL/min (ref >60)
GLUCOSE: 86 mg/dL (ref 65–99)
Potassium: 4.5 mmol/L (ref 3.5–5.1)
Sodium: 137 mmol/L (ref 135–145)
TOTAL PROTEIN: 6.3 g/dL — AB (ref 6.5–8.1)

## 2017-02-01 LAB — CBC WITH DIFFERENTIAL/PLATELET
BASOS ABS: 0 10*3/uL (ref 0.0–0.1)
Basophils Relative: 0 %
Eosinophils Absolute: 0.1 10*3/uL (ref 0.0–0.7)
Eosinophils Relative: 2 %
HEMATOCRIT: 40.7 % (ref 39.0–52.0)
Hemoglobin: 13.6 g/dL (ref 13.0–17.0)
LYMPHS ABS: 0.6 10*3/uL — AB (ref 0.7–4.0)
Lymphocytes Relative: 9 %
MCH: 23.2 pg — ABNORMAL LOW (ref 26.0–34.0)
MCHC: 33.4 g/dL (ref 30.0–36.0)
MCV: 69.5 fL — ABNORMAL LOW (ref 78.0–100.0)
MONOS PCT: 7 %
Monocytes Absolute: 0.4 10*3/uL (ref 0.1–1.0)
NEUTROS ABS: 5.2 10*3/uL (ref 1.7–7.7)
Neutrophils Relative %: 82 %
Platelets: 250 10*3/uL (ref 150–400)
RBC: 5.86 MIL/uL — ABNORMAL HIGH (ref 4.22–5.81)
RDW: 15.3 % (ref 11.5–15.5)
WBC: 6.3 10*3/uL (ref 4.0–10.5)

## 2017-02-01 LAB — URINALYSIS, ROUTINE W REFLEX MICROSCOPIC
Bacteria, UA: NONE SEEN
Bilirubin Urine: NEGATIVE
Glucose, UA: NEGATIVE mg/dL
Ketones, ur: NEGATIVE mg/dL
Leukocytes, UA: NEGATIVE
Nitrite: NEGATIVE
PH: 6 (ref 5.0–8.0)
Protein, ur: NEGATIVE mg/dL
SPECIFIC GRAVITY, URINE: 1.018 (ref 1.005–1.030)
Squamous Epithelial / LPF: NONE SEEN

## 2017-02-01 MED ORDER — IPRATROPIUM-ALBUTEROL 0.5-2.5 (3) MG/3ML IN SOLN
3.0000 mL | Freq: Once | RESPIRATORY_TRACT | Status: AC
  Administered 2017-02-01: 3 mL via RESPIRATORY_TRACT
  Filled 2017-02-01: qty 3

## 2017-02-01 NOTE — ED Triage Notes (Signed)
Pt c/o painless hematuria onset yesterday. Pt reports had 3-4 episodes yesterday and then clear urine one time. This morning pt woke up with blood in urine again.

## 2017-02-01 NOTE — ED Provider Notes (Signed)
[facility] Provider Note   CSN: [id number] Arrival date & time: 02/01/17  7829     History   Chief Complaint Chief Complaint  Patient presents with  . Hematuria    HPI Devin Lopez is a 46 y.o. male who  has a past medical history of Pneumonia. He presents w/ c/o hematuria.  Onset yesterday. He has had it every time he urinates except once when he drank a lot of water. He denies pain, frequency, urgency, back pain. This is a new problem. He denies any lesions, discharge, testicle pain. He is a daily smoker and states he no longer uses marijuana or cocaine for the past year.  HPI  Past Medical History:  Diagnosis Date  . Pneumonia     There are no active problems to display for this patient.   Past Surgical History:  Procedure Laterality Date  . FINGER SURGERY         Home Medications    Prior to Admission medications   Medication Sig Start Date End Date Taking? Authorizing Provider  albuterol (PROVENTIL HFA;VENTOLIN HFA) 108 (90 Base) MCG/ACT inhaler Inhale 1-2 puffs into the lungs every 6 (six) hours as needed for wheezing or shortness of breath. 01/16/17   [name], MD  budesonide-formoterol [facility]) 160-4.5 MCG/ACT inhaler Inhale 2 puffs into the lungs 2 (two) times daily. 01/16/17 01/17/17  [name], MD  doxycycline (VIBRAMYCIN) 100 MG capsule Take 1 capsule (100 mg total) by mouth 2 (two) times daily. One po bid x 7 days Patient taking differently: Take 100 mg by mouth 2 (two) times daily.  01/12/17   [name], PA-C  guaiFENesin (MUCINEX) 600 MG 12 hr tablet Take 600 mg by mouth 2 (two) times daily as needed for cough.    Historical Provider, MD  predniSONE (DELTASONE) 10 MG tablet Take 6 tablets (60 mg total) by mouth daily. 01/16/17   [name], MD  Spacer/Aero-Holding [facility] DIAMOND-MD MASK) MISC 1 Device by Does not apply route Once PRN. 10/28/16   Cheri Fowler, PA-C    Family History Family History  Problem Relation  Age of Onset  . Diabetes Mother   . Aneurysm Mother   . Diabetes Maternal Grandmother     Social History Social History  Substance Use Topics  . Smoking status: Current Some Day Smoker    Packs/day: 0.50    Types: Cigarettes  . Smokeless tobacco: Never Used  . Alcohol use No     Allergies   Patient has no known allergies.   Review of Systems Review of Systems   Physical Exam Updated Vital Signs BP 125/90   Pulse 70   Temp 97.8 F (36.6 C) (Oral)   Resp 18   Ht  (1.651 m)   Wt 63.5 kg   SpO2 100%   BMI 23.30 kg/m   Physical Exam  Constitutional: He appears well-developed and well-nourished. No distress.  HENT:  Head: Normocephalic and atraumatic.  Eyes: Conjunctivae are normal. No scleral icterus.  Neck: Normal range of motion. Neck supple.  Cardiovascular: Normal rate, regular rhythm and normal heart sounds.   Pulmonary/Chest: Effort normal. No respiratory distress. He has wheezes.  Diffuse expiratory wheezes  Abdominal: Soft. There is no tenderness.  Genitourinary: Penis normal.  Musculoskeletal: He exhibits no edema.  Neurological: He is alert.  Skin: Skin is warm and dry. He is not diaphoretic.  Psychiatric: His behavior is normal.  Nursing note and vitals reviewed.    ED Treatments / Results  Labs (all labs ordered are listed, but only abnormal results are displayed) Labs Reviewed  URINALYSIS, ROUTINE W REFLEX MICROSCOPIC    EKG  EKG Interpretation None       Radiology No results found.  Procedures Procedures (including critical care time)  Medications Ordered in ED Medications - No data to display   Initial Impression / Assessment and Plan / ED Course  I have reviewed the triage vital signs and the nursing notes.  Pertinent labs & imaging results that were available during my care of the patient were reviewed by me and considered in my medical decision making (see chart for details).     Patient with gross hematuria. No  signs of infection. He is a smoker and therefor at increased risk of urologic cancers.Discussed this with the patient. He is advised to follow up with urology. No signs of infection at this time. Patient understands and agrees with plan of care. I have consult with the case manager who also ensure [facility].  Final Clinical Impressions(s) / ED Diagnoses   Final diagnoses:  Gross hematuria    New Prescriptions New Prescriptions   No medications on file     Arthor Captain, PA-C 02/02/17 1513    Margarita Grizzle, MD 02/05/17 1406

## 2017-02-01 NOTE — Discharge Instructions (Signed)
Your urine shows a lot of blood, but there is no sign of infection. There are many causes of hematuria (blood in your urine) that are benign  (not serious), however some serious causes are infection and bladder cancer. Because of this it is EXTREMELY IMPORTANT THAT YOU FOLLOW UP WITH THE UROLOGIST for further evaluation of the blood in your urine EVEN IF IT GETS BETTER.    Contact a health care provider if: You develop back pain. You have a fever. You have a feeling of sickness in your stomach (nausea) or vomiting. Your symptoms are not better in 3 days. Return sooner if you are getting worse. Get help right away if: You develop severe vomiting and are unable to keep the medicine down. You develop severe back or abdominal pain despite taking your medicines. You begin passing a large amount of blood or clots in your urine. You feel extremely weak or faint, or you pass out.

## 2017-02-01 NOTE — ED Notes (Signed)
Pt did not need anything at this time  

## 2017-02-01 NOTE — ED Notes (Signed)
Pt given urinal for urine sample. States he urinated PTA but will attempt.

## 2017-02-03 ENCOUNTER — Other Ambulatory Visit: Payer: Self-pay | Admitting: *Deleted

## 2017-02-03 ENCOUNTER — Telehealth: Payer: Self-pay

## 2017-02-03 MED ORDER — FLUTICASONE FUROATE-VILANTEROL 200-25 MCG/INH IN AEPB: 1.0000 | INHALATION_SPRAY | Freq: Every day | RESPIRATORY_TRACT | 3 refills | Status: DC

## 2017-02-03 MED ORDER — ALBUTEROL SULFATE HFA 108 (90 BASE) MCG/ACT IN AERS: 1.0000 | INHALATION_SPRAY | Freq: Four times a day (QID) | RESPIRATORY_TRACT | 3 refills | Status: AC | PRN

## 2017-02-03 MED ORDER — ALBUTEROL SULFATE HFA 108 (90 BASE) MCG/ACT IN AERS: 1.0000 | INHALATION_SPRAY | Freq: Four times a day (QID) | RESPIRATORY_TRACT | 3 refills | Status: DC | PRN

## 2017-02-03 NOTE — Telephone Encounter (Signed)
PRINTED FOR PASS PROGRAM 

## 2017-02-03 NOTE — Telephone Encounter (Signed)
Message received from Marianna Fuss, RN CM requesting follow up for the patient after his ED visit. Call placed to the patient and informed him that there are not any hospital follow up appointments available at this time,so he will need to call the clinic at a later date to check for availability. Provided him with the clinic [phone].  He said that he would follow up.  Update provided to Carrolyn Meiers, RN CM

## 2017-02-04 ENCOUNTER — Telehealth: Payer: Self-pay

## 2017-02-04 ENCOUNTER — Emergency Department (HOSPITAL_COMMUNITY): Payer: Self-pay

## 2017-02-04 ENCOUNTER — Emergency Department (HOSPITAL_COMMUNITY)
Admission: EM | Admit: 2017-02-04 | Discharge: 2017-02-04 | Disposition: A | Discharge status: Home / Self Care | Payer: Self-pay | Attending: Emergency Medicine | Admitting: Emergency Medicine

## 2017-02-04 ENCOUNTER — Encounter (HOSPITAL_COMMUNITY): Payer: Self-pay

## 2017-02-04 DIAGNOSIS — F17210 Nicotine dependence, cigarettes, uncomplicated: Secondary | ICD-10-CM | POA: Insufficient documentation

## 2017-02-04 DIAGNOSIS — N1330 Unspecified hydronephrosis: Secondary | ICD-10-CM | POA: Insufficient documentation

## 2017-02-04 DIAGNOSIS — R109 Unspecified abdominal pain: Secondary | ICD-10-CM

## 2017-02-04 DIAGNOSIS — N201 Calculus of ureter: Secondary | ICD-10-CM | POA: Insufficient documentation

## 2017-02-04 LAB — URINALYSIS, ROUTINE W REFLEX MICROSCOPIC
BILIRUBIN URINE: NEGATIVE
Glucose, UA: NEGATIVE mg/dL
KETONES UR: NEGATIVE mg/dL
Nitrite: NEGATIVE
PH: 6 (ref 5.0–8.0)
Protein, ur: NEGATIVE mg/dL
SPECIFIC GRAVITY, URINE: 1.015 (ref 1.005–1.030)
SQUAMOUS EPITHELIAL / LPF: NONE SEEN

## 2017-02-04 MED ORDER — TAMSULOSIN HCL 0.4 MG PO CAPS: 0.4000 mg | ORAL_CAPSULE | Freq: Every day | ORAL | 0 refills | Status: AC

## 2017-02-04 MED ORDER — IBUPROFEN 600 MG PO TABS: 600.0000 mg | ORAL_TABLET | Freq: Four times a day (QID) | ORAL | 0 refills | Status: AC | PRN

## 2017-02-04 MED ORDER — HYDROCODONE-ACETAMINOPHEN 5-325 MG PO TABS: 1.0000 | ORAL_TABLET | Freq: Three times a day (TID) | ORAL | 0 refills | Status: AC | PRN

## 2017-02-04 MED ORDER — KETOROLAC TROMETHAMINE 60 MG/2ML IM SOLN
30.0000 mg | Freq: Once | INTRAMUSCULAR | Status: AC
  Administered 2017-02-04: 30 mg via INTRAMUSCULAR
  Filled 2017-02-04: qty 2

## 2017-02-04 MED FILL — TAMSULOSIN HCL 0.4 MG CAP: 0.4 | 14 days supply | Qty: 14 | Fill #0

## 2017-02-04 MED FILL — IBUPROFEN 600 MG TABLET: 600 | 8 days supply | Qty: 30 | Fill #0

## 2017-02-04 MED FILL — !SYMBICORT 160-4.5 MCG INH: 160-4.5 | 30 days supply | Qty: 1 | Fill #1

## 2017-02-04 NOTE — Discharge Instructions (Signed)
If pain worsens and becomes persistent or you develop a fever, please return to the ED.

## 2017-02-04 NOTE — Telephone Encounter (Signed)
Call placed to the patient as an appointment became available. He was very appreciative of the call and an appointment was scheduled for 02/06/17 @ 0930. This CM also confirmed with him the address of the clinic.  Update provided to Lodema Hong, RN CM

## 2017-02-04 NOTE — ED Provider Notes (Signed)
[facility]-EMERGENCY DEPT Provider Note   CSN: [id number] Arrival date & time: 02/04/17  1259   By signing my name below, I, Devin Lopez, attest that this documentation has been prepared under the direction and in the presence of Devin Conn, MD  Electronically Signed: [name], ED Scribe. 02/04/17. 2:37 PM.   History   Chief Complaint Chief Complaint  Patient presents with  . Flank Pain    HPI Comments:  Devin Lopez is a 46 y.o. male who presents to the Emergency Department complaining of acute onset, intermittent episodes of sharp left flank pain which last for a few hours beginning today at 10AM. He states his pain radiates to his groin. His pain is worse with movement during the episodes of pain and when raising his left lower extremity. Pt states his current episode of pain began after he used Symbicort in the AM today. 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 He has used icy hot and taken Aleve with temporary, mild relief.  Pt denies a hx of kidney stones, a hx of lupus, a hx of kidney disease, any medication allergies or any recent trauma. He also denies current hematuria or any other associated symptoms. No other complaints noted. Per chart review, pt was seen in the ED on 02/01/17 for hematuria which looked like tea, had unremarkable lab work, was discharged and advised to follow up with urology.   The history is provided by the patient. No language interpreter was used.  Flank Pain  This is a new problem. The current episode started 6 to 12 hours ago. Episode frequency: intermittent  The problem has not changed since onset.The symptoms are relieved by NSAIDs. Treatments tried: NSAIDs. The treatment provided mild relief.    Past Medical History:  Diagnosis Date  . Pneumonia     There are no active problems to display for this patient.   Past Surgical History:  Procedure  Laterality Date  . FINGER SURGERY      Home Medications    Prior to Admission medications   Medication Sig Start Date End Date Taking? Authorizing Provider  albuterol (PROVENTIL HFA;VENTOLIN HFA) 108 (90 Base) MCG/ACT inhaler Inhale 1-2 puffs into the lungs every 6 (six) hours as needed for wheezing or shortness of breath. 02/03/17   Quentin Angst, MD  budesonide-formoterol (SYMBICORT) 160-4.5 MCG/ACT inhaler Inhale 2 puffs into the lungs 2 (two) times daily. 01/16/17 02/01/17  [name], MD  doxycycline (VIBRAMYCIN) 100 MG capsule Take 1 capsule (100 mg total) by mouth 2 (two) times daily. One po bid x 7 days Patient not taking: Reported on 02/01/2017 01/12/17   [name], PA-C  fluticasone furoate-vilanterol (BREO ELLIPTA) 200-25 MCG/INH AEPB Inhale 1 puff into the lungs daily. 02/03/17   Quentin Angst, MD  guaiFENesin (MUCINEX) 600 MG 12 hr tablet Take 600 mg by mouth 2 (two) times daily as needed for cough.    Historical Provider, MD  HYDROcodone-acetaminophen (NORCO/VICODIN) 5-325 MG tablet Take 1 tablet by mouth every 8 (eight) hours as needed for severe pain (That is not improved by your scheduled acetaminophen regimen). Please do not exceed 4000 mg of acetaminophen (Tylenol) a 24-hour period. Please note that he may be prescribed additional medicine that contains acetaminophen. 02/04/17 02/09/17  Devin Conn, MD  ibuprofen (ADVIL,MOTRIN) 600 MG tablet Take 1 tablet (600 mg total) by mouth every 6 (six) hours as  needed. 02/04/17   Devin Conn, MD  predniSONE (DELTASONE) 10 MG tablet Take 6 tablets (60 mg total) by mouth daily. Patient not taking: Reported on 02/01/2017 01/16/17   [name], MD  Spacer/Aero-Holding Chambers (OPTICHAMBER DIAMOND-MD MASK) MISC 1 Device by Does not apply route Once PRN. Patient not taking: Reported on 02/01/2017 10/28/16   Cheri Fowler, PA-C  tamsulosin (FLOMAX) 0.4 MG CAPS capsule Take 1 capsule (0.4 mg total) by mouth daily. 02/04/17  02/18/17  Devin Conn, MD    Family History Family History  Problem Relation Age of Onset  . Diabetes Mother   . Aneurysm Mother   . Diabetes Maternal Grandmother     Social History Social History  Substance Use Topics  . Smoking status: Current Some Day Smoker    Packs/day: 0.50    Types: Cigarettes  . Smokeless tobacco: Never Used  . Alcohol use No     Allergies   Patient has no known allergies.   Review of Systems Review of Systems  Genitourinary: Positive for flank pain.   All other systems reviewed and are negative for acute change except as noted in the HPI.  Physical Exam Updated Vital Signs BP 140/66 (BP Location: Right Arm)   Pulse 64   Temp 98.4 F (36.9 C) (Oral)   Resp 16   SpO2 99%   Physical Exam  Constitutional: He is oriented to person, place, and time. He appears well-developed and well-nourished. No distress.  HENT:  Head: Normocephalic and atraumatic.  Nose: Nose normal.  Eyes: Conjunctivae and EOM are normal. Pupils are equal, round, and reactive to light. Right eye exhibits no discharge. Left eye exhibits no discharge. No scleral icterus.  Neck: Normal range of motion. Neck supple.  Cardiovascular: Normal rate and regular rhythm.  Exam reveals no gallop and no friction rub.   No murmur heard. Pulmonary/Chest: Effort normal and breath sounds normal. No stridor. No respiratory distress. He has no rales.  Abdominal: Soft. He exhibits no distension. There is no tenderness. There is CVA tenderness (leg).  Musculoskeletal: He exhibits tenderness. He exhibits no edema.  Left lumbar paraspinal muscle tenderness.   Neurological: He is alert and oriented to person, place, and time.  Skin: Skin is warm and dry. No rash noted. He is not diaphoretic. No erythema.  Psychiatric: He has a normal mood and affect.  Vitals reviewed.    ED Treatments / Results  DIAGNOSTIC STUDIES:  Oxygen Saturation is 100% on RA, normal by my interpretation.     COORDINATION OF CARE:  2:37 PM Discussed treatment plan with pt at bedside and pt agreed to plan.  Labs (all labs ordered are listed, but only abnormal results are displayed) Labs Reviewed  URINALYSIS, ROUTINE W REFLEX MICROSCOPIC - Abnormal; Notable for the following:       Result Value   APPearance HAZY (*)    Hgb urine dipstick MODERATE (*)    Leukocytes, UA SMALL (*)    Bacteria, UA RARE (*)    All other components within normal limits    EKG  EKG Interpretation None       Radiology Ct Renal Stone Study  Result Date: 02/04/2017 CLINICAL DATA:  Acute onset of intermittent sharp LEFT flank pain which lasts for a few hours beginning today at 1000 hours, pain radiating to groin, pain worse with movement and raising LEFT lower extremity, some nausea, no noted hematuria, question kidney stones, history smoking EXAM: CT ABDOMEN AND PELVIS WITHOUT CONTRAST TECHNIQUE: Multidetector  CT imaging of the abdomen and pelvis was performed following the standard protocol without IV contrast. Sagittal and coronal MPR images reconstructed from axial data set. New COMPARISON:  None FINDINGS: Lower chest: Severe emphysematous changes with scarring and question minimal reticulonodular infiltrate in the RIGHT lower lobe. 5 mm RIGHT lower lobe nodular density question spiculated RIGHT lower lobe image 2. Tiny additional nonspecific nodular foci at lung bases. Hepatobiliary: Liver and gallbladder unremarkable by noncontrast CT Pancreas: Normal appearance Spleen: Normal appearance Adrenals/Urinary Tract: LEFT hydronephrosis secondary to a 6 mm diameter x 8 mm length proximal LEFT ureteral calculus image 35. LEFT kidney and distal LEFT ureter otherwise normal. Bladder unremarkable. Stomach/Bowel: Appendix not definitely visualized. Limited assessment of stomach and bowel loops due to lack of IV and oral contrast, no gross abnormality seen Vascular/Lymphatic: Unremarkable Reproductive: Minimally prominent  prostate gland and seminal vesicles. Other: Small amount of nonspecific low-attenuation free pelvic fluid. No free air or hernia. Musculoskeletal: Nonspecific 8 mm lucent lesion within the RIGHT pedicle of L2 image 26, nonspecific but unchanged since 10/28/2016. Additional small lucent focus with surrounding sclerosis in the RIGHT iliac bone image 59, generally benign in appearance. No additional osseous findings. IMPRESSION: LEFT hydronephrosis due to a 6 mm diameter proximal LEFT ureteral calculus. Emphysematous changes at lung bases with RIGHT basilar scarring and reticulonodular atypical infiltrate in the RIGHT lower lobe, consider atypical infection. Bibasilar pulmonary nodules largest a 5 mm RIGHT lower lobe nodule; Non-contrast chest CT should be considered in 12 months if patient is high-risk. This recommendation follows the consensus statement: Guidelines for Management of Incidental Pulmonary Nodules Detected on CT Images: From the Fleischner Society 2017; Radiology 2017; 284:228-243. Electronically Signed   By: Ulyses Southward M.D.   On: 02/04/2017 15:47    Procedures Procedures (including critical care time)  EMERGENCY DEPARTMENT US RENAL EXAM  "Study: Limited Retroperitoneal Ultrasound of Kidneys"  INDICATIONS: Flank pain Long and short axis of both kidneys were obtained.   PERFORMED BY: Myself IMAGES ARCHIVED?: Yes LIMITATIONS: None VIEWS USED: Long axis and Short axis  INTERPRETATION: Left  Hydronephrosis mild, Left Kidney stone in the proximal ureter    Medications Ordered in ED Medications  ketorolac (TORADOL) injection 30 mg (30 mg Intramuscular Given 02/04/17 1439)     Initial Impression / Assessment and Plan / ED Course  I have reviewed the triage vital signs and the nursing notes.  Pertinent labs & imaging results that were available during my care of the patient were reviewed by me and considered in my medical decision making (see chart for details).     Workup  confirming 6 mm stone in the proximal left ureter with mild hydronephrosis. Treated with Toradol which had improvement in his pain. No evidence of significant obstruction. UA without evidence of infection. We will treat symptomatically and also provide patient with Flomax. Urology follow-up.  The patient is safe for discharge with strict return precautions.   Final Clinical Impressions(s) / ED Diagnoses   Final diagnoses:  Flank pain  Ureteral stone   Disposition: Discharge  Condition: Good  I have discussed the results, Dx and Tx plan with the patient who expressed understanding and agree(s) with the plan. Discharge instructions discussed at great length. The patient was given strict return precautions who verbalized understanding of the instructions. No further questions at time of discharge.    New Prescriptions   HYDROCODONE-ACETAMINOPHEN (NORCO/VICODIN) 5-325 MG TABLET    Take 1 tablet by mouth every 8 (eight) hours as needed for  severe pain (That is not improved by your scheduled acetaminophen regimen). Please do not exceed 4000 mg of acetaminophen (Tylenol) a 24-hour period. Please note that he may be prescribed additional medicine that contains acetaminophen.   IBUPROFEN (ADVIL,MOTRIN) 600 MG TABLET    Take 1 tablet (600 mg total) by mouth every 6 (six) hours as needed.   TAMSULOSIN (FLOMAX) 0.4 MG CAPS CAPSULE    Take 1 capsule (0.4 mg total) by mouth daily.    Follow Up: primary care provider  Schedule an appointment as soon as possible for a visit  for additional pain control  Crist Fat, MD [address] [place] [address] [phone]  Schedule an appointment as soon as possible for a visit in 1 week For close follow up to assess for left kidney stone   I personally performed the services described in this documentation, which was scribed in my presence. The recorded information has been reviewed and is accurate.        Devin Conn,  MD 02/04/17 [phone]

## 2017-02-04 NOTE — ED Triage Notes (Signed)
Patient here with flank pain and radiation to groin sinc am. Here a few days ago for hematuria

## 2017-02-06 ENCOUNTER — Ambulatory Visit: Payer: Self-pay | Attending: Internal Medicine | Admitting: Physician Assistant

## 2017-02-06 VITALS — BP 124/86 | HR 75 | Temp 97.6°F | Resp 16 | Wt 126.0 lb

## 2017-02-06 DIAGNOSIS — Z87442 Personal history of urinary calculi: Secondary | ICD-10-CM | POA: Insufficient documentation

## 2017-02-06 DIAGNOSIS — F17200 Nicotine dependence, unspecified, uncomplicated: Secondary | ICD-10-CM

## 2017-02-06 DIAGNOSIS — Z79899 Other long term (current) drug therapy: Secondary | ICD-10-CM | POA: Insufficient documentation

## 2017-02-06 DIAGNOSIS — R918 Other nonspecific abnormal finding of lung field: Secondary | ICD-10-CM

## 2017-02-06 DIAGNOSIS — Z7689 Persons encountering health services in other specified circumstances: Secondary | ICD-10-CM | POA: Insufficient documentation

## 2017-02-06 DIAGNOSIS — R112 Nausea with vomiting, unspecified: Secondary | ICD-10-CM

## 2017-02-06 DIAGNOSIS — IMO0001 Reserved for inherently not codable concepts without codable children: Secondary | ICD-10-CM

## 2017-02-06 DIAGNOSIS — R319 Hematuria, unspecified: Secondary | ICD-10-CM

## 2017-02-06 DIAGNOSIS — R911 Solitary pulmonary nodule: Secondary | ICD-10-CM

## 2017-02-06 DIAGNOSIS — N200 Calculus of kidney: Secondary | ICD-10-CM

## 2017-02-06 LAB — POCT URINALYSIS DIPSTICK
Glucose, UA: NEGATIVE
Ketones, UA: 15
Nitrite, UA: NEGATIVE
PH UA: 5.5 (ref 5.0–8.0)
PROTEIN UA: 30
SPEC GRAV UA: 1.025 (ref 1.010–1.025)
UROBILINOGEN UA: 0.2 U/dL

## 2017-02-06 MED ORDER — ONDANSETRON HCL 4 MG PO TABS
4.0000 mg | ORAL_TABLET | Freq: Three times a day (TID) | ORAL | 0 refills | Status: DC | PRN
Start: 2017-02-06 — End: 2017-04-13

## 2017-02-06 MED FILL — ?ONDANSETRON HCL 4 MG TABLE: 4 | 6 days supply | Qty: 20 | Fill #0

## 2017-02-06 NOTE — Progress Notes (Signed)
Devin Lopez, is a 46 y.o. male  ZOX:[id number]  WJX:[id number]  DOB - 1971/01/20  Subjective:  Chief Complaint and HPI: Devin Lopez is a 46 y.o. male here today to establish care and for a follow up visit after being seen in the ED 02/01/2017 and 02/04/2017 for kidney stone.  He was put on flomax and pain medications.  His pain has improved some and has moved from the L side of his back into his LLQ of his abdomen.  He denies radiating pain into his groin.  He was not given a urinary strainer.  He called to schedule a urology appt but could not afford it.  His pain is manageable but he did have 1 episode of vomiting yesterday and is experiencing intermittent nausea.  He does not drink water.  Pain has lessened overall and is mild since being seen in the hospital.  Also-smoker.  Was seen by [facility] Pulmonology 12/06/2016.  Diagnosed with lung disease-f/up planned in 6 months.  ED/Hospital notes reviewed.  CT shows 6mm stone with hydronephrosis on the L and an incidental finding of Lung nodule of 5mm that needs follow-up.  He says he is supposed to see pulmonology in 6 months.  He denies hemoptysis, night sweats or chills.  No unexplained weight loss.    ROS:   Constitutional:  No f/c, No night sweats, No unexplained weight loss. EENT:  No vision changes, No blurry vision, No hearing changes. No mouth, throat, or ear problems.  Respiratory: No cough, No SOB Cardiac: No CP, no palpitations GI:  No abd pain, No D.  +mild nausea and 1 episode of vomiting yesterday. GU: No Urinary s/sx Musculoskeletal: No joint pain Neuro: No headache, no dizziness, no motor weakness.  Skin: No rash Endocrine:  No polydipsia. No polyuria.  Psych: Denies SI/HI  No problems updated.  ALLERGIES: No Known Allergies  PAST MEDICAL HISTORY: Past Medical History:  Diagnosis Date  . Pneumonia     MEDICATIONS AT HOME: Prior to Admission medications   Medication Sig Start Date End Date Taking?  Authorizing Provider  albuterol (PROVENTIL HFA;VENTOLIN HFA) 108 (90 Base) MCG/ACT inhaler Inhale 1-2 puffs into the lungs every 6 (six) hours as needed for wheezing or shortness of breath. 02/03/17   Quentin Angst, MD  budesonide-formoterol (SYMBICORT) 160-4.5 MCG/ACT inhaler Inhale 2 puffs into the lungs 2 (two) times daily. 01/16/17 02/01/17  [name], MD  fluticasone furoate-vilanterol (BREO ELLIPTA) 200-25 MCG/INH AEPB Inhale 1 puff into the lungs daily. 02/03/17   Quentin Angst, MD  guaiFENesin (MUCINEX) 600 MG 12 hr tablet Take 600 mg by mouth 2 (two) times daily as needed for cough.    Historical Provider, MD  HYDROcodone-acetaminophen (NORCO/VICODIN) 5-325 MG tablet Take 1 tablet by mouth every 8 (eight) hours as needed for severe pain (That is not improved by your scheduled acetaminophen regimen). Please do not exceed 4000 mg of acetaminophen (Tylenol) a 24-hour period. Please note that he may be prescribed additional medicine that contains acetaminophen. 02/04/17 02/09/17  [name], MD  ibuprofen (ADVIL,MOTRIN) 600 MG tablet Take 1 tablet (600 mg total) by mouth every 6 (six) hours as needed. 02/04/17   [name], MD  ondansetron (ZOFRAN) 4 MG tablet Take 1 tablet (4 mg total) by mouth every 8 (eight) hours as needed for nausea or vomiting. 02/06/17   Anders Simmonds, PA-C  tamsulosin (FLOMAX) 0.4 MG CAPS capsule Take 1 capsule (0.4 mg total) by mouth daily. 02/04/17 02/18/17  [name], MD     Objective:  EXAM:   Vitals:   02/06/17 0948  BP: 124/86  Pulse: 75  Resp: 16  Temp: 97.6 F (36.4 C)  TempSrc: Oral  SpO2: 97%  Weight: 126 lb (57.2 kg)    General appearance : A&OX3. NAD. Non-toxic-appearing HEENT: Atraumatic and Normocephalic.  PERRLA. EOM intact.  TM clear B. Mouth-MMM, post pharynx WNL w/o erythema, No PND. Neck: supple, no JVD. No cervical lymphadenopathy. No thyromegaly Chest/Lungs:  Breathing-non-labored, Good air entry  bilaterally, breath sounds normal without rales, rhonchi, or wheezing  CVS: S1 S2 regular, no murmurs, gallops, rubs  Abdomen: Bowel sounds present, Non tender and not distended with no gaurding, rigidity or rebound.  Mild CVA TTP on L Extremities: Bilateral Lower Ext shows no edema, both legs are warm to touch with = pulse throughout Neurology:  CN II-XII grossly intact, Non focal.   Psych:  TP linear. J/I WNL. Normal speech. Appropriate eye contact and affect.  Skin:  No Rash  Data Review No results found for: HGBA1C   Assessment & Plan   1. Nephrolithiasis Must increase water intake to at least 100 ounces of water daily.  Urine strainer given.  Get financial packet.    2. Hematuria, unspecified type Due to #1 - POCT urinalysis dipstick  3. Nausea and vomiting, intractability of vomiting not specified, unspecified vomiting type Zofran Rx sent-Nausea likely due to hydrocodone or to #1  4. Smoking Smoking cessation imperative.  Gave information and resources.  It does seem he is in the contemplative stage since having to see the pulmonologist and having an abnormal scan  5. Lung nodule as incidental finding on CT renal study. -keep f/up with pulmonology(due in August) - CT CHEST LUNG CANCER SCREENING LOW DOSE WO CONTRAST; Future  Patient have been counseled extensively about nutrition and exercise  Return in about 2 weeks (around 02/20/2017) for assign PCP; recheck kidney stone.  The patient was given clear instructions to go to ER or return to medical center if symptoms don't improve, worsen or new problems develop. The patient verbalized understanding. The patient was told to call to get lab results if they haven't heard anything in the next week.     Georgian Co, PA-C [facility] [place], [place] [phone]   02/06/2017, 10:19 AM

## 2017-02-06 NOTE — Patient Instructions (Signed)
Smoking Tobacco Information Smoking tobacco will very likely harm your health. Tobacco contains a poisonous (toxic), colorless chemical called nicotine. Nicotine affects the brain and makes tobacco addictive. This change in your brain can make it hard to stop smoking. Tobacco also has other toxic chemicals that can hurt your body and raise your risk of many cancers. How can smoking tobacco affect me? Smoking tobacco can increase your chances of having serious health conditions, such as:  Cancer. Smoking is most commonly associated with lung cancer, but can lead to cancer in other parts of the body.  Chronic obstructive pulmonary disease (COPD). This is a long-term lung condition that makes it hard to breathe. It also gets worse over time.  High blood pressure (hypertension), heart disease, stroke, or heart attack.  Lung infections, such as pneumonia.  Cataracts. This is when the lenses in the eyes become clouded.  Digestive problems. This may include peptic ulcers, heartburn, and gastroesophageal reflux disease (GERD).  Oral health problems, such as gum disease and tooth loss.  Loss of taste and smell. Smoking can affect your appearance by causing:  Wrinkles.  Yellow or stained teeth, fingers, and fingernails. Smoking tobacco can also affect your social life.  Many workplaces, [employer], hotels, and public places are tobacco-free. This means that you may experience challenges in finding places to smoke when away from home.  The cost of a smoking habit can be expensive. Expenses for someone who smokes come in two ways:  You spend money on a regular basis to buy tobacco.  Your health care costs in the long-term are higher if you smoke.  Tobacco smoke can also affect the health of those around you. Children of smokers have greater chances of:  Sudden infant death syndrome (SIDS).  Ear infections.  Lung infections. What lifestyle changes can be made?  Do not start smoking.  Quit if you already do.  To quit smoking:  Make a plan to quit smoking and commit yourself to it. Look for programs to help you and ask your health care provider for recommendations and ideas.  Talk with your health care provider about using nicotine replacement medicines to help you quit. Medicine replacement medicines include gum, lozenges, patches, sprays, or pills.  Do not replace cigarette smoking with electronic cigarettes, which are commonly called e-cigarettes. The safety of e-cigarettes is not known, and some may contain harmful chemicals.  Avoid places, people, or situations that tempt you to smoke.  If you try to quit but return to smoking, don't give up hope. It is very common for people to try a number of times before they fully succeed. When you feel ready again, give it another try.  Quitting smoking might affect the way you eat as well as your weight. Be prepared to monitor your eating habits. Get support in planning and following a healthy diet.  Ask your health care provider about having regular tests (screenings) to check for cancer. This may include blood tests, imaging tests, and other tests.  Exercise regularly. Consider taking walks, joining a gym, or doing yoga or exercise classes.  Develop skills to manage your stress. These skills include meditation. What are the benefits of quitting smoking? By quitting smoking, you may:  Lower your risk of getting cancer and other diseases caused by smoking.  Live longer.  Breathe better.  Lower your blood pressure and heart rate.  Stop your addiction to tobacco.  Stop creating secondhand smoke that hurts other people.  Improve your sense of taste  and smell.  Look better over time, due to having fewer wrinkles and less staining. What can happen if changes are not made? If you do not stop smoking, you may:  Get cancer and other diseases.  Develop COPD or other long-term (chronic) lung conditions.  Develop  serious problems with your heart and blood vessels (cardiovascular system).  Need more tests to screen for problems caused by smoking.  Have higher, long-term healthcare costs from medicines or treatments related to smoking.  Continue to have worsening changes in your lungs, mouth, and nose. Where to find support: To get support to quit smoking, consider:  Asking your health care provider for more information and resources.  Taking classes to learn more about quitting smoking.  Looking for local organizations that offer resources about quitting smoking.  Joining a support group for people who want to quit smoking in your local community. Where to find more information: You may find more information about quitting smoking from:  HelpGuide.org: www.helpguide.org/articles/addictions/how-to-quit-smoking.htm  [employer]: smokefree.gov  American Lung Association: www.lung.org Contact a health care provider if:  You have problems breathing.  Your lips, nose, or fingers turn blue.  You have chest pain.  You are coughing up blood.  You feel faint or you pass out.  You have other noticeable changes that cause you to worry. Summary  Smoking tobacco can negatively affect your health, the health of those around you, your finances, and your social life.  Do not start smoking. Quit if you already do. If you need help quitting, ask your health care provider.  Think about joining a support group for people who want to quit smoking in your local community. There are many effective programs that will help you to quit this behavior. This information is not intended to replace advice given to you by your health care provider. Make sure you discuss any questions you have with your health care provider. Document Released: 10/29/2016 Document Revised: 10/29/2016 Document Reviewed: 10/29/2016 Elsevier Interactive Patient Education  2017 [employer] Kidney Stones Kidney stones  (urolithiasis) are rock-like masses that form inside of the kidneys. Kidneys are organs that make pee (urine). A kidney stone can cause very bad pain and can block the flow of pee. The stone usually leaves your body (passes) through your pee. You may need to have a doctor take out the stone. Follow these instructions at home: Eating and drinking   Drink enough fluid to keep your pee clear or pale yellow. This will help you pass the stone.  If told by your doctor, change the foods you eat (your diet). This may include:  Limiting how much salt (sodium) you eat.  Eating more fruits and vegetables.  Limiting how much meat, poultry, fish, and eggs you eat.  Follow instructions from your doctor about eating or drinking restrictions. General instructions   Collect pee samples as told by your doctor. You may need to collect a pee sample:  24 hours after a stone comes out.  8-12 weeks after a stone comes out, and every 6-12 months after that.  Strain your pee every time you pee (urinate), for as long as told. Use the strainer that your doctor recommends.  Do not throw out the stone. Keep it so that it can be tested by your doctor.  Take over-the-counter and prescription medicines only as told by your doctor.  Keep all follow-up visits as told by your doctor. This is important. You may need follow-up tests. Preventing kidney stones  To prevent  another kidney stone:  Drink enough fluid to keep your pee clear or pale yellow. This is the best way to prevent kidney stones.  Eat healthy foods.  Avoid certain foods as told by your doctor. You may be told to eat less protein.  Stay at a healthy weight. Contact a doctor if:  You have pain that gets worse or does not get better with medicine. Get help right away if:  You have a fever or chills.  You get very bad pain.  You get new pain in your belly (abdomen).  You pass out (faint).  You cannot pee. This information is not intended  to replace advice given to you by your health care provider. Make sure you discuss any questions you have with your health care provider. Document Released: 04/01/2008 Document Revised: 07/02/2016 Document Reviewed: 07/02/2016 Elsevier Interactive Patient Education  2017 [employer].

## 2017-02-13 ENCOUNTER — Other Ambulatory Visit: Payer: Self-pay | Admitting: *Deleted

## 2017-02-13 MED ORDER — FLUTICASONE FUROATE-VILANTEROL 200-25 MCG/INH IN AEPB: 1.0000 | INHALATION_SPRAY | Freq: Every day | RESPIRATORY_TRACT | 3 refills | Status: DC

## 2017-02-13 NOTE — Telephone Encounter (Signed)
E-SCRIBED UNTIL PASS PROGRAM IS EFFECTIVE

## 2017-02-20 ENCOUNTER — Ambulatory Visit: Payer: Self-pay | Admitting: Family Medicine

## 2017-02-26 ENCOUNTER — Ambulatory Visit: Payer: Self-pay | Admitting: Family Medicine

## 2017-03-04 ENCOUNTER — Emergency Department (HOSPITAL_COMMUNITY): Payer: Self-pay

## 2017-03-04 ENCOUNTER — Encounter (HOSPITAL_COMMUNITY): Payer: Self-pay | Admitting: Emergency Medicine

## 2017-03-04 ENCOUNTER — Emergency Department (HOSPITAL_COMMUNITY)
Admission: EM | Admit: 2017-03-04 | Discharge: 2017-03-04 | Disposition: A | Discharge status: Home / Self Care | Payer: Self-pay | Attending: Emergency Medicine | Admitting: Emergency Medicine

## 2017-03-04 ENCOUNTER — Telehealth: Payer: Self-pay

## 2017-03-04 DIAGNOSIS — R0600 Dyspnea, unspecified: Secondary | ICD-10-CM | POA: Insufficient documentation

## 2017-03-04 DIAGNOSIS — F17210 Nicotine dependence, cigarettes, uncomplicated: Secondary | ICD-10-CM | POA: Insufficient documentation

## 2017-03-04 MED ORDER — ALBUTEROL SULFATE (2.5 MG/3ML) 0.083% IN NEBU: 2.5000 mg | INHALATION_SOLUTION | Freq: Four times a day (QID) | RESPIRATORY_TRACT | 12 refills | Status: AC | PRN

## 2017-03-04 MED ORDER — PREDNISONE 20 MG PO TABS: 40.0000 mg | ORAL_TABLET | Freq: Every day | ORAL | 0 refills | Status: DC

## 2017-03-04 MED ORDER — ALBUTEROL SULFATE HFA 108 (90 BASE) MCG/ACT IN AERS
2.0000 | INHALATION_SPRAY | Freq: Once | RESPIRATORY_TRACT | Status: AC
  Administered 2017-03-04: 2 via RESPIRATORY_TRACT
  Filled 2017-03-04: qty 6.7

## 2017-03-04 MED ORDER — PREDNISONE 20 MG PO TABS
40.0000 mg | ORAL_TABLET | Freq: Once | ORAL | Status: AC
  Administered 2017-03-04: 40 mg via ORAL
  Filled 2017-03-04: qty 2

## 2017-03-04 MED ORDER — ALBUTEROL SULFATE (2.5 MG/3ML) 0.083% IN NEBU
5.0000 mg | INHALATION_SOLUTION | Freq: Once | RESPIRATORY_TRACT | Status: AC
  Administered 2017-03-04: 5 mg via RESPIRATORY_TRACT
  Filled 2017-03-04: qty 6

## 2017-03-04 MED FILL — ?ALBUTEROL SUL 2.5 MG/3 MLS: (2.5 MG/3ML | 7 days supply | Qty: 90 | Fill #0

## 2017-03-04 MED FILL — ?PREDNISONE 20 MG TABLET: 20 | 5 days supply | Qty: 10 | Fill #0

## 2017-03-04 NOTE — ED Provider Notes (Signed)
[facility]-EMERGENCY DEPT Provider Note   CSN: [id number] Arrival date & time: 03/04/17  1003  By signing my name below, I, Sonum Patel, attest that this documentation has been prepared under the direction and in the presence of Raeford RazorKohut, Debanhi Blaker, MD. Electronically Signed: Sonum Patel, Neurosurgeoncribe. 03/04/17. 12:42 PM.  History   Chief Complaint Chief Complaint  Patient presents with  . Shortness of Breath    The history is provided by the patient. No language interpreter was used.     HPI Comments: Devin Lopez is a 46 y.o. male who presents to the Emergency Department complaining of an episode of SOB for the past few days. Patient states he has these episodes occur every few months. He states the SOB is exertional and worsens with increased activity. He uses Symbicort and an albuterol inhaler which he has been compliant with. He has an appointment with pulmonology on 03/20/17. He denies associated CP, cough, fever, chills, leg pain, leg swelling.    Past Medical History:  Diagnosis Date  . Pneumonia     There are no active problems to display for this patient.   Past Surgical History:  Procedure Laterality Date  . FINGER SURGERY         Home Medications    Prior to Admission medications   Medication Sig Start Date End Date Taking? Authorizing Provider  albuterol (PROVENTIL HFA;VENTOLIN HFA) 108 (90 Base) MCG/ACT inhaler Inhale 1-2 puffs into the lungs every 6 (six) hours as needed for wheezing or shortness of breath. 02/03/17   Quentin AngstJegede, Olugbemiga E, MD  budesonide-formoterol (SYMBICORT) 160-4.5 MCG/ACT inhaler Inhale 2 puffs into the lungs 2 (two) times daily. 01/16/17 02/01/17  Azalia Bilisampos, Kevin, MD  fluticasone furoate-vilanterol (BREO ELLIPTA) 200-25 MCG/INH AEPB Inhale 1 puff into the lungs daily. 02/13/17   Quentin AngstJegede, Olugbemiga E, MD  guaiFENesin (MUCINEX) 600 MG 12 hr tablet Take 600 mg by mouth 2 (two) times daily as needed for cough.    [provider]  ibuprofen (ADVIL,MOTRIN)  600 MG tablet Take 1 tablet (600 mg total) by mouth every 6 (six) hours as needed. 02/04/17   Nira Connardama, Pedro Eduardo, MD  ondansetron (ZOFRAN) 4 MG tablet Take 1 tablet (4 mg total) by mouth every 8 (eight) hours as needed for nausea or vomiting. 02/06/17   Anders SimmondsMcClung, Angela M, PA-C    Family History Family History  Problem Relation Age of Onset  . Diabetes Mother   . Aneurysm Mother   . Diabetes Maternal Grandmother     Social History Social History  Substance Use Topics  . Smoking status: Current Some Day Smoker    Packs/day: 0.50    Types: Cigarettes  . Smokeless tobacco: Never Used  . Alcohol use No     Allergies   Patient has no known allergies.   Review of Systems Review of Systems  All other systems reviewed and are negative for acute change except as noted in the HPI.   Physical Exam Updated Vital Signs BP 134/76 (BP Location: Right Arm)   Pulse 77   Temp 98.2 F (36.8 C) (Oral)   Resp 16   Ht 5\' 5"  (1.651 m)   Wt 130 lb (59 kg)   SpO2 100%   BMI 21.63 kg/m   Physical Exam  Constitutional: He is oriented to person, place, and time. He appears well-developed and well-nourished.  HENT:  Head: Normocephalic and atraumatic.  Eyes: EOM are normal.  Neck: Normal range of motion.  Cardiovascular: Normal rate, regular rhythm, normal heart  sounds and intact distal pulses.   Pulmonary/Chest: Effort normal. No respiratory distress. He has wheezes.  No distress, speaks in complete sentences. Expiratory wheezing bilaterally   Abdominal: Soft. He exhibits no distension. There is no tenderness.  Musculoskeletal: Normal range of motion.  Neurological: He is alert and oriented to person, place, and time.  Skin: Skin is warm and dry.  Psychiatric: He has a normal mood and affect. Judgment normal.  Nursing note and vitals reviewed.    ED Treatments / Results  DIAGNOSTIC STUDIES: Oxygen Saturation is 100% on RA, normal by my interpretation.    COORDINATION OF  CARE: 12:30 PM Discussed treatment plan with pt at bedside and pt agreed to plan.   Labs (all labs ordered are listed, but only abnormal results are displayed) Labs Reviewed - No data to display  EKG  EKG Interpretation None       Radiology Dg Chest 2 View  Result Date: 03/04/2017 CLINICAL DATA:  Shortness of breath for 2 days EXAM: CHEST  2 VIEW COMPARISON:  01/16/2017 FINDINGS: Cardiac shadow is within normal limits. Chronic scarring is again identified throughout both lungs predominately in the mid and upper lung fields. No sizable effusion is seen. No acute bony abnormality is noted. 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 Electronically Signed   By: Alcide Clever M.D.   On: 03/04/2017 11:46    Procedures Procedures (including critical care time)  Medications Ordered in ED Medications  albuterol (PROVENTIL) (2.5 MG/3ML) 0.083% nebulizer solution 5 mg (5 mg Nebulization Given 03/04/17 1231)     Initial Impression / Assessment and Plan / ED Course  I have reviewed the triage vital signs and the nursing notes.  Pertinent labs & imaging results that were available during my care of the patient were reviewed by me and considered in my medical decision making (see chart for details).     46yM with dyspnea. Underlying lung disease w/o formal diagnosis with pulmonology appointment later this month. I'm not sure what more I can add in the ED today since he is not acutely ill. May benefit from a course of steroids. Albuterol inhaler provided and meds for nebulizer. It has been determined that no acute conditions requiring further emergency intervention are present at this time. The patient has been advised of the diagnosis and plan. I reviewed any labs and imaging including any potential incidental findings. We have discussed signs and symptoms that warrant return to the ED and they are listed in the discharge instructions.    Final Clinical  Impressions(s) / ED Diagnoses   Final diagnoses:  Dyspnea, unspecified type    New Prescriptions New Prescriptions   No medications on file   I personally preformed the services scribed in my presence. The recorded information has been reviewed is accurate. Raeford Razor, MD.     Raeford Razor, MD 03/10/17 [phone]

## 2017-03-04 NOTE — ED Notes (Signed)
Pt states he understands instructions. Home stable with steady gait. 

## 2017-03-04 NOTE — Telephone Encounter (Signed)
Met with the patient when he presented to the clinic with a prescription from the ED  for a nebulizer. Discussed with Dr Jarold Song as the patient has not yet established care at [facility].  The  paperwork from [employer] has been given to Dr Jarold Song for signature. The patient does not have any insurance.  This CM met with the patient to educate him on the use and care of the nebulizer. He was able to demonstrate how to correctly assemble, use  and disassemble the machine.  Provided him with the written  information about care of the machine. He signed the required documents and was given the machine.

## 2017-03-04 NOTE — ED Triage Notes (Signed)
Pt c/o shortness of breath-- for "a couple months" has appt with pulmonologist on 5/24 -- lungs diminshed on right LL-- wheezes inspiratory upper lobes.

## 2017-03-04 NOTE — Discharge Instructions (Signed)
Follow-up with pulmonology as previously scheduled.

## 2017-03-10 ENCOUNTER — Telehealth: Payer: Self-pay

## 2017-03-10 NOTE — Telephone Encounter (Signed)
Signed application for nebulizer faxed to [employer] # [phone]

## 2017-03-28 ENCOUNTER — Ambulatory Visit: Payer: Self-pay | Admitting: Family Medicine

## 2017-04-07 MED FILL — ALBUTEROL 0.083% INHAL SOLN: (2.5 MG/3ML | 7 days supply | Qty: 90 | Fill #1

## 2017-04-08 ENCOUNTER — Emergency Department (HOSPITAL_COMMUNITY)
Admission: EM | Admit: 2017-04-08 | Discharge: 2017-04-09 | Disposition: A | Discharge status: Home / Self Care | Payer: Self-pay | Attending: Emergency Medicine | Admitting: Emergency Medicine

## 2017-04-08 ENCOUNTER — Emergency Department (HOSPITAL_COMMUNITY): Payer: Self-pay

## 2017-04-08 ENCOUNTER — Encounter (HOSPITAL_COMMUNITY): Payer: Self-pay

## 2017-04-08 DIAGNOSIS — F17210 Nicotine dependence, cigarettes, uncomplicated: Secondary | ICD-10-CM | POA: Insufficient documentation

## 2017-04-08 DIAGNOSIS — R0789 Other chest pain: Secondary | ICD-10-CM | POA: Insufficient documentation

## 2017-04-08 DIAGNOSIS — J441 Chronic obstructive pulmonary disease with (acute) exacerbation: Secondary | ICD-10-CM | POA: Insufficient documentation

## 2017-04-08 DIAGNOSIS — R062 Wheezing: Secondary | ICD-10-CM | POA: Insufficient documentation

## 2017-04-08 LAB — BASIC METABOLIC PANEL
Anion gap: 8 (ref 5–15)
BUN: 11 mg/dL (ref 6–20)
CALCIUM: 9.2 mg/dL (ref 8.9–10.3)
CO2: 26 mmol/L (ref 22–32)
CREATININE: 1.04 mg/dL (ref 0.61–1.24)
Chloride: 101 mmol/L (ref 101–111)
GLUCOSE: 86 mg/dL (ref 65–99)
Potassium: 3.7 mmol/L (ref 3.5–5.1)
Sodium: 135 mmol/L (ref 135–145)

## 2017-04-08 LAB — CBC
HCT: 37.4 % — ABNORMAL LOW (ref 39.0–52.0)
Hemoglobin: 12.3 g/dL — ABNORMAL LOW (ref 13.0–17.0)
MCH: 23.6 pg — AB (ref 26.0–34.0)
MCHC: 32.9 g/dL (ref 30.0–36.0)
MCV: 71.8 fL — ABNORMAL LOW (ref 78.0–100.0)
PLATELETS: 251 10*3/uL (ref 150–400)
RBC: 5.21 MIL/uL (ref 4.22–5.81)
RDW: 15.7 % — ABNORMAL HIGH (ref 11.5–15.5)
WBC: 11.6 10*3/uL — ABNORMAL HIGH (ref 4.0–10.5)

## 2017-04-08 LAB — I-STAT TROPONIN, ED: TROPONIN I, POC: 0 ng/mL (ref 0.00–0.08)

## 2017-04-08 MED ORDER — ALBUTEROL SULFATE (2.5 MG/3ML) 0.083% IN NEBU
INHALATION_SOLUTION | RESPIRATORY_TRACT | Status: AC
  Filled 2017-04-08: qty 6

## 2017-04-08 MED ORDER — ALBUTEROL SULFATE (2.5 MG/3ML) 0.083% IN NEBU
5.0000 mg | INHALATION_SOLUTION | Freq: Once | RESPIRATORY_TRACT | Status: AC
  Administered 2017-04-08: 5 mg via RESPIRATORY_TRACT

## 2017-04-08 MED ORDER — IBUPROFEN 400 MG PO TABS
600.0000 mg | ORAL_TABLET | Freq: Once | ORAL | Status: AC
  Administered 2017-04-09: 600 mg via ORAL
  Filled 2017-04-08: qty 1

## 2017-04-08 MED ORDER — METHYLPREDNISOLONE SODIUM SUCC 125 MG IJ SOLR
80.0000 mg | Freq: Once | INTRAMUSCULAR | Status: AC
  Administered 2017-04-09: 80 mg via INTRAVENOUS
  Filled 2017-04-08: qty 2

## 2017-04-08 MED ORDER — IPRATROPIUM-ALBUTEROL 0.5-2.5 (3) MG/3ML IN SOLN
3.0000 mL | Freq: Once | RESPIRATORY_TRACT | Status: AC
  Administered 2017-04-09: 3 mL via RESPIRATORY_TRACT
  Filled 2017-04-08: qty 3

## 2017-04-08 NOTE — ED Provider Notes (Signed)
[facility]EMER[facility]ENCY DEPT Provider Note   CSN: [id number] Arrival date & time: 04/08/17  2006  By signing my name below, I, Rosario Adie, attest that this documentation has been prepared under the direction and in the presence of Norris Brumbach, Mayer Masker, MD. Electronically Signed: Rosario Adie, ED Scribe. 04/08/17. 11:45 PM.  History   Chief Complaint Chief Complaint  Patient presents with  . Chest Pain  . Shortness of Breath   The history is provided by the patient. No language interpreter was used.    HPI Comments: Devin Lopez is a 46 y.o. male who presents to the Emergency Department complaining of persistent right-sided chest pain beginning four days ago. Pt was seen for his ongoing cough on 04/04/17 (~4 days ago) and at that time he was dx'd w/ PNA. He was prescribed Azithromycin and Prednisone at that time which he has been taking, however, he states that his pain has been persistent despite this. He reports associated shortness of breath and wheezing. He has not been taking any medications for pain control at home. Pt rates his pain as 8/10 at its worst and is exacerbating with coughing. He is currently followed by a Pulmonologist whom he last saw four years ago, he was started on Symbicort at that time. He does currently use a nebulizer at home and has been using this more frequently.No h/o PE/DVT, recent long travel, surgery, fracture, prolonged immobilization. Pt is a current, everyday smoker. He denies fever, hemoptysis, leg swelling, or any other associated symptoms.   Past Medical History:  Diagnosis Date  . Pneumonia    There are no active problems to display for this patient.  Past Surgical History:  Procedure Laterality Date  . FINGER SURGERY      Home Medications    Prior to Admission medications   Medication Sig Start Date End Date Taking? Authorizing Provider  albuterol (PROVENTIL HFA;VENTOLIN HFA) 108 (90 Base) MCG/ACT inhaler Inhale 1-2 puffs into the  lungs every 6 (six) hours as needed for wheezing or shortness of breath. 02/03/17  Yes Quentin Angst, MD  albuterol (PROVENTIL) (2.5 MG/3ML) 0.083% nebulizer solution Take 3 mLs (2.5 mg total) by nebulization every 6 (six) hours as needed for wheezing or shortness of breath. 03/04/17  Yes Raeford Razor, MD  azithromycin (ZITHROMAX) 250 MG tablet Take 250 mg by mouth daily.   Yes [provider]  guaiFENesin (MUCINEX) 600 MG 12 hr tablet Take 600 mg by mouth 2 (two) times daily as needed for cough.   Yes [provider]  ibuprofen (ADVIL,MOTRIN) 600 MG tablet Take 1 tablet (600 mg total) by mouth every 6 (six) hours as needed. Patient taking differently: Take 600 mg by mouth every 6 (six) hours as needed for moderate pain.  02/04/17  Yes Cardama, Amadeo Garnet, MD  ondansetron (ZOFRAN) 4 MG tablet Take 1 tablet (4 mg total) by mouth every 8 (eight) hours as needed for nausea or vomiting. 02/06/17  Yes Georgian Co M, PA-C  predniSONE (DELTASONE) 20 MG tablet Take 2 tablets (40 mg total) by mouth daily. 03/04/17  Yes Raeford Razor, MD  fluticasone furoate-vilanterol (BREO ELLIPTA) 200-25 MCG/INH AEPB Inhale 1 puff into the lungs daily. Patient not taking: Reported on 04/08/2017 02/13/17   Quentin Angst, MD  naproxen (NAPROSYN) 500 MG tablet Take 1 tablet (500 mg total) by mouth 2 (two) times daily. 04/09/17   Jahmya Onofrio, Mayer Masker, MD   Family History Family History  Problem Relation Age of Onset  .  Diabetes Mother   . Aneurysm Mother   . Diabetes Maternal Grandmother    Social History Social History  Substance Use Topics  . Smoking status: Current Some Day Smoker    Packs/day: 0.50    Types: Cigarettes  . Smokeless tobacco: Never Used  . Alcohol use No   Allergies   Patient has no known allergies.  Review of Systems Review of Systems  Constitutional: Negative for fever.  Respiratory: Positive for shortness of breath and wheezing.   Cardiovascular: Positive for  chest pain. Negative for leg swelling.  Gastrointestinal: Negative for abdominal pain, nausea and vomiting.  All other systems reviewed and are negative.  Physical Exam Updated Vital Signs BP (!) 145/98   Pulse 79   Temp 98.3 F (36.8 C)   Resp (!) 25   Ht 5\' 5"  (1.651 m)   Wt 55.8 kg (123 lb)   SpO2 95%   BMI 20.47 kg/m   Physical Exam  Constitutional: He is oriented to person, place, and time. He appears well-developed and well-nourished.  thin  HENT:  Head: Normocephalic and atraumatic.  Cardiovascular: Normal rate, regular rhythm and normal heart sounds.   No murmur heard. Pulmonary/Chest: Effort normal. No respiratory distress. He has wheezes. He has rales. He exhibits tenderness.  Diffuse expiratory wheeze  Abdominal: Soft. There is no tenderness.  Musculoskeletal: He exhibits no edema.  Neurological: He is alert and oriented to person, place, and time.  Skin: Skin is warm and dry.  Psychiatric: He has a normal mood and affect.  Nursing note and vitals reviewed.  ED Treatments / Results  DIAGNOSTIC STUDIES: Oxygen Saturation is 99% on RA, normal by my interpretation.   COORDINATION OF CARE: 11:45 PM-Discussed next steps with pt. Pt verbalized understanding and is agreeable with the plan.   Labs (all labs ordered are listed, but only abnormal results are displayed) Labs Reviewed  CBC - Abnormal; Notable for the following:       Result Value   WBC 11.6 (*)    Hemoglobin 12.3 (*)    HCT 37.4 (*)    MCV 71.8 (*)    MCH 23.6 (*)    RDW 15.7 (*)    All other components within normal limits  BASIC METABOLIC PANEL  I-STAT TROPOININ, ED   EKG  EKG Interpretation  Date/Time:  Tuesday April 08 2017 20:13:54 EDT Ventricular Rate:  92 PR Interval:  144 QRS Duration: 90 QT Interval:  342 QTC Calculation: 422 R Axis:   103 Text Interpretation:  Normal sinus rhythm Possible Left atrial enlargement Rightward axis Possible Anterior infarct , age undetermined  Abnormal ECG No significant change since last tracing Confirmed by Ross MarcusHorton, Fayne Mcguffee ([id number]) on 04/08/2017 11:02:29 PM      Radiology Dg Chest 2 View  Result Date: 04/08/2017 CLINICAL DATA:  Chest pain.  History of pneumonia. EXAM: CHEST  2 VIEW COMPARISON:  03/04/2017 FINDINGS: Severe biapical masses with upward retraction of the hila superimposed on severe emphysema and extensive interstitial accentuation not appreciably changed from recent prior exams. No new infiltrate is observed. Heart size within normal limits.  No significant bony abnormality. IMPRESSION: 1. Stable appearance of the chest without new infiltrate identified. 2. Biapical masslike appearance with extensive coarse interstitial accentuation, upward retraction of the hila, and severe underlying emphysema. Progressive massive fibrosis in the setting of pneumoconiosis such as silicosis can cause such an appearance. No significant change from 10/28/2016. Electronically Signed   By: Gaylyn RongWalter  Liebkemann M.D.   On: 04/08/2017  21:14   Procedures Procedures   Medications Ordered in ED Medications  albuterol (PROVENTIL) (2.5 MG/3ML) 0.083% nebulizer solution (not administered)  albuterol (PROVENTIL) (2.5 MG/3ML) 0.083% nebulizer solution 5 mg (5 mg Nebulization Given 04/08/17 2028)  ipratropium-albuterol (DUONEB) 0.5-2.5 (3) MG/3ML nebulizer solution 3 mL (3 mLs Nebulization Given 04/09/17 0003)  ibuprofen (ADVIL,MOTRIN) tablet 600 mg (600 mg Oral Given 04/09/17 0003)  methylPREDNISolone sodium succinate (SOLU-MEDROL) 125 mg/2 mL injection 80 mg (80 mg Intravenous Given 04/09/17 0003)   Initial Impression / Assessment and Plan / ED Course  I have reviewed the triage vital signs and the nursing notes.  Pertinent labs & imaging results that were available during my care of the patient were reviewed by me and considered in my medical decision making (see chart for details).     Patient presents with persistent wheezing and onset of  right-sided chest and flank pain. Worse with coughing and breathing. Otherwise nontoxic. He is wheezing and has coarse rales on exam. No respiratory distress. Chest wall is tender to palpation.  Patient was given a DuoNeb. He was given ibuprofen for pain. He was able to ambulate and maintain pulse ox with ambulation and was no acute distress. Likely COPD exacerbation with chest wall pain. EKG and troponin reassuring. Follow-up with pulmonologist.  After history, exam, and medical workup I feel the patient has been appropriately medically screened and is safe for discharge home. Pertinent diagnoses were discussed with the patient. Patient was given return precautions.   Final Clinical Impressions(s) / ED Diagnoses   Final diagnoses:  Wheezing  COPD exacerbation (HCC)  Atypical chest pain   New Prescriptions New Prescriptions   NAPROXEN (NAPROSYN) 500 MG TABLET    Take 1 tablet (500 mg total) by mouth 2 (two) times daily.   I personally performed the services described in this documentation, which was scribed in my presence. The recorded information has been reviewed and is accurate.     Shon Baton, MD 04/09/17 [phone]

## 2017-04-08 NOTE — ED Triage Notes (Signed)
Onset 4 days ago shortness of breath, cough, right chest pain.  [facility], was prescribed Azithromycin, Prednisone.  Pt reports no improvement in symptoms.

## 2017-04-09 MED ORDER — NAPROXEN 500 MG PO TABS: 500.0000 mg | ORAL_TABLET | Freq: Two times a day (BID) | ORAL | 0 refills | Status: DC

## 2017-04-09 MED FILL — $VENTOLIN HFA 18G INHALER: 108 (90 BAS | 30 days supply | Qty: 18 | Fill #0

## 2017-04-09 NOTE — Discharge Instructions (Signed)
You were seen today for right-sided chest pain. This is likely related to your upper respiratory infection and COPD. Follow-up with your pulmonologist. Continue prednisone at home as well as your nebulizers.

## 2017-04-09 NOTE — ED Notes (Signed)
Pt ambulated around Pod B with no difficulty. Pt stayed between 98% and 100% RA and stated he felt fine with up walking around. Blondell RevealYasiema RN notifed

## 2017-04-13 ENCOUNTER — Emergency Department (HOSPITAL_COMMUNITY)
Admission: EM | Admit: 2017-04-13 | Discharge: 2017-04-13 | Disposition: A | Discharge status: Home / Self Care | Payer: Self-pay | Attending: Emergency Medicine | Admitting: Emergency Medicine

## 2017-04-13 ENCOUNTER — Other Ambulatory Visit: Payer: Self-pay

## 2017-04-13 ENCOUNTER — Emergency Department (HOSPITAL_COMMUNITY): Payer: Self-pay

## 2017-04-13 DIAGNOSIS — Z79899 Other long term (current) drug therapy: Secondary | ICD-10-CM | POA: Insufficient documentation

## 2017-04-13 DIAGNOSIS — F17210 Nicotine dependence, cigarettes, uncomplicated: Secondary | ICD-10-CM | POA: Insufficient documentation

## 2017-04-13 DIAGNOSIS — R0602 Shortness of breath: Secondary | ICD-10-CM | POA: Insufficient documentation

## 2017-04-13 LAB — BASIC METABOLIC PANEL
ANION GAP: 8 (ref 5–15)
BUN: 21 mg/dL — ABNORMAL HIGH (ref 6–20)
CO2: 27 mmol/L (ref 22–32)
Calcium: 9.1 mg/dL (ref 8.9–10.3)
Chloride: 101 mmol/L (ref 101–111)
Creatinine, Ser: 1.15 mg/dL (ref 0.61–1.24)
Glucose, Bld: 118 mg/dL — ABNORMAL HIGH (ref 65–99)
Potassium: 4.2 mmol/L (ref 3.5–5.1)
Sodium: 136 mmol/L (ref 135–145)

## 2017-04-13 LAB — CBC WITH DIFFERENTIAL/PLATELET
BASOS ABS: 0 10*3/uL (ref 0.0–0.1)
BASOS PCT: 0 %
EOS PCT: 0 %
Eosinophils Absolute: 0 10*3/uL (ref 0.0–0.7)
HEMATOCRIT: 38.3 % — AB (ref 39.0–52.0)
Hemoglobin: 12.8 g/dL — ABNORMAL LOW (ref 13.0–17.0)
Lymphocytes Relative: 1 %
Lymphs Abs: 0.2 10*3/uL — ABNORMAL LOW (ref 0.7–4.0)
MCH: 24 pg — ABNORMAL LOW (ref 26.0–34.0)
MCHC: 33.4 g/dL (ref 30.0–36.0)
MCV: 71.7 fL — ABNORMAL LOW (ref 78.0–100.0)
MONO ABS: 0.6 10*3/uL (ref 0.1–1.0)
Monocytes Relative: 3 %
NEUTROS ABS: 16.2 10*3/uL — AB (ref 1.7–7.7)
Neutrophils Relative %: 96 %
PLATELETS: 303 10*3/uL (ref 150–400)
RBC: 5.34 MIL/uL (ref 4.22–5.81)
RDW: 15.4 % (ref 11.5–15.5)
WBC: 17.1 10*3/uL — AB (ref 4.0–10.5)

## 2017-04-13 MED ORDER — IPRATROPIUM-ALBUTEROL 0.5-2.5 (3) MG/3ML IN SOLN
3.0000 mL | RESPIRATORY_TRACT | Status: AC
Start: 2017-04-13 — End: 2017-04-13
  Administered 2017-04-13 (×2): 3 mL via RESPIRATORY_TRACT
  Filled 2017-04-13: qty 6

## 2017-04-13 MED ORDER — NICOTINE 21 MG/24HR TD PT24: 21.0000 mg | MEDICATED_PATCH | Freq: Every day | TRANSDERMAL | 0 refills | Status: AC

## 2017-04-13 MED ORDER — ALBUTEROL SULFATE HFA 108 (90 BASE) MCG/ACT IN AERS
2.0000 | INHALATION_SPRAY | Freq: Once | RESPIRATORY_TRACT | Status: AC
  Administered 2017-04-13: 2 via RESPIRATORY_TRACT
  Filled 2017-04-13: qty 6.7

## 2017-04-13 MED ORDER — PREDNISONE 50 MG PO TABS: 50.0000 mg | ORAL_TABLET | Freq: Every day | ORAL | 0 refills | Status: AC

## 2017-04-13 MED ORDER — PREDNISONE 20 MG PO TABS
60.0000 mg | ORAL_TABLET | Freq: Once | ORAL | Status: AC
  Administered 2017-04-13: 60 mg via ORAL
  Filled 2017-04-13: qty 3

## 2017-04-13 NOTE — ED Provider Notes (Signed)
[facility] DEPT Provider Note   CSN: [id number] Arrival date & time: 04/13/17  1333     History   Chief Complaint Chief Complaint  Patient presents with  . Shortness of Breath    HPI Devin Lopez is a 46 y.o. male.  HPI  Patient is a 46 year old male past medical history significant for tobacco use, who presents to the emergency department for a 2 day history of worsening shortness of breath.  Shortness of breath is worse with exertion but now also present at rest. Endorses worsening cough with mild sputum production for the past 2 days. Denies chest pain, fever, chills, nausea, vomiting, diaphoresis. No prior history of PE/DVT. Doubt smoking one cigarette a day. Patient was evaluated by pulmonology in February, diagnosed with interstitial lung disease of unknown etiology, plan for a bronchitic in the future. Next appointment is in August. States he has been using his home inhalers but ran out of his albuterol inhaler within the past week.  Past Medical History:  Diagnosis Date  . Pneumonia     There are no active problems to display for this patient.   Past Surgical History:  Procedure Laterality Date  . FINGER SURGERY         Home Medications    Prior to Admission medications   Medication Sig Start Date End Date Taking? Authorizing Provider  albuterol (PROVENTIL) (2.5 MG/3ML) 0.083% nebulizer solution Take 3 mLs (2.5 mg total) by nebulization every 6 (six) hours as needed for wheezing or shortness of breath. 03/04/17  Yes Raeford RazorKohut, Stephen, MD  guaiFENesin (MUCINEX) 600 MG 12 hr tablet Take 600 mg by mouth 2 (two) times daily as needed for cough.   Yes [provider]  ibuprofen (ADVIL,MOTRIN) 600 MG tablet Take 1 tablet (600 mg total) by mouth every 6 (six) hours as needed. Patient taking differently: Take 600 mg by mouth every 6 (six) hours as needed for moderate pain.  02/04/17  Yes Cardama, Amadeo GarnetPedro Eduardo, MD  albuterol (PROVENTIL HFA;VENTOLIN HFA) 108 (90  Base) MCG/ACT inhaler Inhale 1-2 puffs into the lungs every 6 (six) hours as needed for wheezing or shortness of breath. Patient not taking: Reported on 04/13/2017 02/03/17   Quentin AngstJegede, Olugbemiga E, MD  nicotine (NICODERM CQ - DOSED IN MG/24 HOURS) 21 mg/24hr patch Place 1 patch (21 mg total) onto the skin daily. 04/13/17   Corena HerterMumma, Keavon Sensing, MD  predniSONE (DELTASONE) 50 MG tablet Take 1 tablet (50 mg total) by mouth daily with breakfast. 04/13/17 04/20/17  Corena HerterMumma, Jyoti Harju, MD    Family History Family History  Problem Relation Age of Onset  . Diabetes Mother   . Aneurysm Mother   . Diabetes Maternal Grandmother     Social History Social History  Substance Use Topics  . Smoking status: Current Some Day Smoker    Packs/day: 0.50    Types: Cigarettes  . Smokeless tobacco: Never Used  . Alcohol use No     Allergies   Patient has no known allergies.   Review of Systems Review of Systems  Constitutional: Negative for chills and fever.  HENT: Positive for congestion.   Eyes: Negative for visual disturbance.  Respiratory: Positive for cough, shortness of breath and wheezing. Negative for chest tightness.   Cardiovascular: Negative for chest pain and palpitations.  Gastrointestinal: Negative for abdominal pain, blood in stool, nausea and vomiting.  Genitourinary: Negative for flank pain.  Musculoskeletal: Negative for back pain.  Skin: Negative for rash.  Neurological: Negative for dizziness and weakness.  Psychiatric/Behavioral: Negative for behavioral problems.     Physical Exam Updated Vital Signs BP (!) 147/92   Pulse 67   Temp 98.7 F (37.1 C) (Oral)   Resp 18   SpO2 98%   Physical Exam  Constitutional: He is oriented to person, place, and time. He appears well-developed and well-nourished.  HENT:  Head: Atraumatic.  Mouth/Throat: Oropharynx is clear and moist.  Eyes: Conjunctivae and EOM are normal.  Neck: Normal range of motion. No JVD present. No tracheal deviation  present.  Cardiovascular: Normal rate, regular rhythm, normal heart sounds and intact distal pulses.   No murmur heard. Pulmonary/Chest: He has rales.  Course breath sounds throughout, intermittent expiratory wheeze. Increased work of breathing with tachypnea.  Abdominal: Soft. He exhibits no distension. There is no tenderness.  Musculoskeletal: Normal range of motion. He exhibits no edema.  No unilateral leg swelling  Neurological: He is alert and oriented to person, place, and time.  Skin: Skin is warm. Capillary refill takes less than 2 seconds.  Psychiatric: He has a normal mood and affect.     ED Treatments / Results  Labs (all labs ordered are listed, but only abnormal results are displayed) Labs Reviewed  CBC WITH DIFFERENTIAL/PLATELET - Abnormal; Notable for the following:       Result Value   WBC 17.1 (*)    Hemoglobin 12.8 (*)    HCT 38.3 (*)    MCV 71.7 (*)    MCH 24.0 (*)    Neutro Abs 16.2 (*)    Lymphs Abs 0.2 (*)    All other components within normal limits  BASIC METABOLIC PANEL - Abnormal; Notable for the following:    Glucose, Bld 118 (*)    BUN 21 (*)    All other components within normal limits    EKG  EKG Interpretation None       Radiology Dg Chest 2 View  Result Date: 04/13/2017 CLINICAL DATA:  Cough EXAM: CHEST  2 VIEW COMPARISON:  04/08/2017 chest radiograph. FINDINGS: Stable cardiomediastinal silhouette with normal heart size. No pneumothorax. No pleural effusion. Severe patchy opacities with associated volume loss and marked parenchymal distortion in the mid to upper lungs bilaterally, not appreciably changed back to 10/28/2016 chest radiograph. No acute consolidative airspace disease. IMPRESSION: 1. No interval change.  No interval consolidative airspace disease . 2. Severe chronic upper lung predominant patchy opacities, volume loss and distortion, suggestive of end-stage pulmonary sarcoidosis. Electronically Signed   By: Delbert Phenix M.D.    On: 04/13/2017 17:18    Procedures Procedures (including critical care time)  Medications Ordered in ED Medications  albuterol (PROVENTIL HFA;VENTOLIN HFA) 108 (90 Base) MCG/ACT inhaler 2 puff (not administered)  predniSONE (DELTASONE) tablet 60 mg (60 mg Oral Given 04/13/17 1650)  ipratropium-albuterol (DUONEB) 0.5-2.5 (3) MG/3ML nebulizer solution 3 mL (3 mLs Nebulization Given 04/13/17 1712)     Initial Impression / Assessment and Plan / ED Course  I have reviewed the triage vital signs and the nursing notes.  Pertinent labs & imaging results that were available during my care of the patient were reviewed by me and considered in my medical decision making (see chart for details).     Patient is a 46 year old male past medical history of interstitial lung disease, tobacco use, who presents to the emergency department with 2 day history of worsening shortness of breath with cough and sputum production. No chest pain. In mild respiratory distress, 98% on room air. Afebrile.  Patient most likely  with acute exacerbation of this interstitial lung disease, possibly undiagnosed COPD. Patient is followed by a pulmonologist and is currently inhalers, being worked up for pneumoconiosis vs autoimmune ILD. Given 2 nebs, steroids, doxycycline. Doubt PE, PERC negative. Doubt ACS, no CP, low risk.   Lab work remarkable for leukocytosis of 17. Hemoglobin stable. No significant electrolyte abnormalities. Chest x-ray showed no change from prior. Severe chronic upper lung patchy opacities. Concerning for end stage pulmonary sarcoidosis per radiology. Patient has no formal diagnosis of sarcoidosis. On chart review, pulmonology lab work remarkable autoimmune disorders was overall negative, elevated angiotensin converting enzyme. No prior testing for TB. Pt does not have a PCP.   Improvement of symptoms following DuoNeb. Able to ambulate.  Stable for discharge home. Given a prescription for prednisone,  albuterol inhaler, nicotine patches. Discussed wearing a mask while at work. Given information to establish a primary care physician. Discussed that he needs to see pulmonology before August.  Final Clinical Impressions(s) / ED Diagnoses   Final diagnoses:  SOB (shortness of breath)    New Prescriptions New Prescriptions   NICOTINE (NICODERM CQ - DOSED IN MG/24 HOURS) 21 MG/24HR PATCH    Place 1 patch (21 mg total) onto the skin daily.   PREDNISONE (DELTASONE) 50 MG TABLET    Take 1 tablet (50 mg total) by mouth daily with breakfast.     Corena Herter, MD 04/13/17 Bennie Hind    Cathren Laine, MD 04/13/17 2053

## 2017-04-13 NOTE — ED Notes (Signed)
Patient transported to X-ray 

## 2017-04-13 NOTE — ED Triage Notes (Signed)
Pt reports he was well yesterday, went walking, no chest pain, no labored breathing, then used the nebulizer machine last night and congested cough, shortness of breath returned.

## 2017-05-07 ENCOUNTER — Telehealth: Payer: Self-pay | Admitting: Pulmonary Disease

## 2017-05-07 NOTE — Telephone Encounter (Signed)
Called and spoke to pt. Pt states he is returning our call about a CT scan. Informed pt of the time, date and location of the CT chest. Pt verbalized understanding and denied any further questions or concerns at this time.

## 2017-06-06 ENCOUNTER — Ambulatory Visit (INDEPENDENT_AMBULATORY_CARE_PROVIDER_SITE_OTHER)
Admission: RE | Admit: 2017-06-06 | Discharge: 2017-06-06 | Disposition: A | Discharge status: Home / Self Care | Payer: Self-pay | Source: Ambulatory Visit | Attending: Pulmonary Disease | Admitting: Pulmonary Disease

## 2017-06-06 DIAGNOSIS — J849 Interstitial pulmonary disease, unspecified: Secondary | ICD-10-CM

## 2017-06-24 ENCOUNTER — Ambulatory Visit: Payer: Self-pay | Admitting: Pulmonary Disease

## 2017-07-29 MED FILL — $VENTOLIN HFA 18G INHALER: 108 (90 BAS | 30 days supply | Qty: 18 | Fill #1

## 2017-11-05 ENCOUNTER — Emergency Department (HOSPITAL_COMMUNITY)
Admission: EM | Admit: 2017-11-05 | Discharge: 2017-11-05 | Disposition: A | Discharge status: Home / Self Care | Payer: Self-pay | Attending: Emergency Medicine | Admitting: Emergency Medicine

## 2017-11-05 ENCOUNTER — Other Ambulatory Visit: Payer: Self-pay

## 2017-11-05 ENCOUNTER — Emergency Department (HOSPITAL_COMMUNITY): Payer: Self-pay

## 2017-11-05 ENCOUNTER — Encounter (HOSPITAL_COMMUNITY): Payer: Self-pay | Admitting: *Deleted

## 2017-11-05 DIAGNOSIS — Z79899 Other long term (current) drug therapy: Secondary | ICD-10-CM | POA: Insufficient documentation

## 2017-11-05 DIAGNOSIS — F17210 Nicotine dependence, cigarettes, uncomplicated: Secondary | ICD-10-CM | POA: Insufficient documentation

## 2017-11-05 DIAGNOSIS — J189 Pneumonia, unspecified organism: Secondary | ICD-10-CM | POA: Insufficient documentation

## 2017-11-05 DIAGNOSIS — J441 Chronic obstructive pulmonary disease with (acute) exacerbation: Secondary | ICD-10-CM | POA: Insufficient documentation

## 2017-11-05 HISTORY — DX: Chronic obstructive pulmonary disease, unspecified: J44.9

## 2017-11-05 LAB — BASIC METABOLIC PANEL
ANION GAP: 7 (ref 5–15)
BUN: 15 mg/dL (ref 6–20)
CO2: 28 mmol/L (ref 22–32)
Calcium: 9.3 mg/dL (ref 8.9–10.3)
Chloride: 105 mmol/L (ref 101–111)
Creatinine, Ser: 1.2 mg/dL (ref 0.61–1.24)
GFR calc Af Amer: >60 mL/min (ref >60)
GFR calc non Af Amer: >60 mL/min (ref >60)
GLUCOSE: 115 mg/dL — AB (ref 65–99)
POTASSIUM: 4.4 mmol/L (ref 3.5–5.1)
Sodium: 140 mmol/L (ref 135–145)

## 2017-11-05 LAB — CBC
HEMATOCRIT: 41.5 % (ref 39.0–52.0)
HEMOGLOBIN: 13.1 g/dL (ref 13.0–17.0)
MCH: 21.8 pg — AB (ref 26.0–34.0)
MCHC: 31.6 g/dL (ref 30.0–36.0)
MCV: 69.1 fL — ABNORMAL LOW (ref 78.0–100.0)
Platelets: 280 10*3/uL (ref 150–400)
RBC: 6.01 MIL/uL — ABNORMAL HIGH (ref 4.22–5.81)
RDW: 20 % — ABNORMAL HIGH (ref 11.5–15.5)
WBC: 15.1 10*3/uL — ABNORMAL HIGH (ref 4.0–10.5)

## 2017-11-05 LAB — I-STAT CG4 LACTIC ACID, ED: Lactic Acid, Venous: 2.15 mmol/L (ref 0.5–1.9)

## 2017-11-05 MED ORDER — SODIUM CHLORIDE 0.9 % IV BOLUS (SEPSIS): 250.0000 mL | Freq: Once | INTRAVENOUS | Status: DC

## 2017-11-05 MED ORDER — LEVOFLOXACIN IN D5W 750 MG/150ML IV SOLN
750.0000 mg | Freq: Once | INTRAVENOUS | Status: AC
  Administered 2017-11-05: 750 mg via INTRAVENOUS
  Filled 2017-11-05: qty 150

## 2017-11-05 MED ORDER — ALBUTEROL SULFATE (2.5 MG/3ML) 0.083% IN NEBU
5.0000 mg | INHALATION_SOLUTION | Freq: Once | RESPIRATORY_TRACT | Status: AC
  Administered 2017-11-05: 5 mg via RESPIRATORY_TRACT
  Filled 2017-11-05: qty 6

## 2017-11-05 MED ORDER — IPRATROPIUM BROMIDE 0.02 % IN SOLN
0.5000 mg | Freq: Once | RESPIRATORY_TRACT | Status: AC
  Administered 2017-11-05: 0.5 mg via RESPIRATORY_TRACT
  Filled 2017-11-05: qty 2.5

## 2017-11-05 MED ORDER — ALBUTEROL SULFATE (5 MG/ML) 0.5% IN NEBU
15.00 | INHALATION_SOLUTION | RESPIRATORY_TRACT | Status: DC
Start: ? — End: 2017-11-05

## 2017-11-05 MED ORDER — SODIUM CHLORIDE 0.9 % IV BOLUS (SEPSIS)
1000.0000 mL | Freq: Once | INTRAVENOUS | Status: AC
  Administered 2017-11-05: 1000 mL via INTRAVENOUS

## 2017-11-05 MED ORDER — ALBUTEROL (5 MG/ML) CONTINUOUS INHALATION SOLN
10.0000 mg/h | INHALATION_SOLUTION | Freq: Once | RESPIRATORY_TRACT | Status: AC
  Administered 2017-11-05: 10 mg/h via RESPIRATORY_TRACT
  Filled 2017-11-05: qty 20

## 2017-11-05 MED ORDER — PREDNISONE 20 MG PO TABS
60.0000 mg | ORAL_TABLET | Freq: Once | ORAL | Status: AC
  Administered 2017-11-05: 60 mg via ORAL
  Filled 2017-11-05: qty 3

## 2017-11-05 MED ORDER — LEVOFLOXACIN 500 MG PO TABS: 500.0000 mg | ORAL_TABLET | Freq: Every day | ORAL | 0 refills | Status: DC

## 2017-11-05 MED ORDER — SODIUM CHLORIDE 0.9 % IV BOLUS (SEPSIS): 500.0000 mL | Freq: Once | INTRAVENOUS | Status: DC

## 2017-11-05 NOTE — ED Notes (Signed)
Pt verbalized understanding discharge instructions and denies any further needs or questions at this time. VS stable, ambulatory and steady gait.   

## 2017-11-05 NOTE — ED Notes (Signed)
Pt called x1 for vitals. No answer 

## 2017-11-05 NOTE — ED Notes (Signed)
Sepsis activated @ 2027  (RN aware)

## 2017-11-05 NOTE — ED Notes (Signed)
Pt called x2 for vitals. No answer 

## 2017-11-05 NOTE — ED Notes (Signed)
Breathing tx finished - pt continues to wheeze. Resp less labored but not resolved. Will have PA see pt.

## 2017-11-05 NOTE — ED Triage Notes (Addendum)
To ED for eval of sob since this am. [facility] and discharged with steriods and abx but was unable to fill due to time he left hospital. Autible wheezing noted. Unable to hold conversation due to sob

## 2017-11-05 NOTE — ED Provider Notes (Signed)
Patient placed in Quick Look pathway, seen and evaluated for chief complaint of shortness of breath.  [facility]. Neg CXR. Did not fill Prednisone. Pertinent H&P findings include SOB. No CP. Wheezing. No fevers..  Based on initial evaluation, labs are indicated and radiology studies are not indicated.  Patient counseled on process, plan, and necessity for staying for completing the evaluation.  Given additional neb treatment and 60mg  PO prednisone.    Audry PiliMohr, Aizlynn Digilio, PA-C 11/05/17 1637    Margarita Grizzleay, Danielle, MD 11/07/17 [phone]

## 2017-11-05 NOTE — ED Provider Notes (Signed)
[facility] EMERGENCY DEPARTMENT Provider Note   CSN: [id number] Arrival date & time: 11/05/17  1517     History   Chief Complaint Chief Complaint  Patient presents with  . Shortness of Breath    HPI Devin Lopez is a 47 y.o. male.  HPI   47 year old male with hx of COPD presenting with SOB.  Patient states since yesterday morning he worsening cough productive of yellow sputum, wheezing, shortness of breath, and now having some hot spells.  He does have history of COPD and uses his nebulizer treatment 4 times yesterday and 4 times a day without relief.  Symptoms has become progressively worse.  Endorsed occasional sneezing and sinus congestion.  He is a smoker and smoked 2-3 cigarettes daily, used cocaine and marijuana occasionally, last use was several weeks ago.  He denies any chest pain, abdominal pain, nausea vomiting diarrhea.  He denies any recent travel, prolonged bed rest, unilateral swelling or calf pain.  Did report having his flu shot.  Denies any prior complication from his COPD requiring intubation or ICU stay.    Past Medical History:  Diagnosis Date  . COPD (chronic obstructive pulmonary disease) (HCC)   . Pneumonia     There are no active problems to display for this patient.   Past Surgical History:  Procedure Laterality Date  . FINGER SURGERY         Home Medications    Prior to Admission medications   Medication Sig Start Date End Date Taking? Authorizing Provider  albuterol (PROVENTIL HFA;VENTOLIN HFA) 108 (90 Base) MCG/ACT inhaler Inhale 1-2 puffs into the lungs every 6 (six) hours as needed for wheezing or shortness of breath. 02/03/17  Yes Quentin Angst, MD  albuterol (PROVENTIL) (2.5 MG/3ML) 0.083% nebulizer solution Take 3 mLs (2.5 mg total) by nebulization every 6 (six) hours as needed for wheezing or shortness of breath. 03/04/17  Yes Raeford Razor, MD  guaiFENesin (MUCINEX) 600 MG 12 hr tablet Take 600 mg by mouth 2  (two) times daily as needed for cough.   Yes [provider]  ibuprofen (ADVIL,MOTRIN) 600 MG tablet Take 1 tablet (600 mg total) by mouth every 6 (six) hours as needed. Patient taking differently: Take 600 mg by mouth every 6 (six) hours as needed for moderate pain.  02/04/17  Yes Cardama, Amadeo Garnet, MD  nicotine (NICODERM CQ - DOSED IN MG/24 HOURS) 21 mg/24hr patch Place 1 patch (21 mg total) onto the skin daily. Patient not taking: Reported on 11/05/2017 04/13/17   Corena Herter, MD    Family History Family History  Problem Relation Age of Onset  . Diabetes Mother   . Aneurysm Mother   . Diabetes Maternal Grandmother     Social History Social History   Tobacco Use  . Smoking status: Current Some Day Smoker    Packs/day: 0.50    Types: Cigarettes  . Smokeless tobacco: Never Used  Substance Use Topics  . Alcohol use: No  . Drug use: No     Allergies   Patient has no known allergies.   Review of Systems Review of Systems  All other systems reviewed and are negative.    Physical Exam Updated Vital Signs BP (!) 139/94   Temp 98.2 F (36.8 C) (Oral)   Resp (!) 22   SpO2 96%   Physical Exam  Constitutional: He appears well-developed and well-nourished. No distress.  Patient in mild respiratory discomfort  HENT:  Head: Atraumatic.  Mouth/Throat: Oropharynx is clear and moist.  Eyes: Conjunctivae are normal.  Neck: Neck supple. No JVD present.  Cardiovascular:  Tachycardia no murmur rubs or gallops  Pulmonary/Chest: Accessory muscle usage present. No stridor. He is in respiratory distress. He has decreased breath sounds. He has wheezes. He has rhonchi. He has no rales.  Abdominal: Soft. There is no tenderness.  Musculoskeletal:       Right lower leg: He exhibits no edema.  Neurological: He is alert.  Skin: No rash noted.  Psychiatric: He has a normal mood and affect.  Nursing note and vitals reviewed.    ED Treatments / Results  Labs (all labs  ordered are listed, but only abnormal results are displayed) Labs Reviewed  BASIC METABOLIC PANEL - Abnormal; Notable for the following components:      Result Value   Glucose, Bld 115 (*)    All other components within normal limits  CBC - Abnormal; Notable for the following components:   WBC 15.1 (*)    RBC 6.01 (*)    MCV 69.1 (*)    MCH 21.8 (*)    RDW 20.0 (*)    All other components within normal limits    EKG  EKG Interpretation None       Radiology Dg Chest 2 View  Result Date: 11/05/2017 CLINICAL DATA:  Shortness of breath and wheezing since yesterday. Discharged from hospital yesterday. History of COPD and pneumonia. EXAM: CHEST  2 VIEW COMPARISON:  CT chest June 06, 2017 and chest radiograph April 13, 2017 FINDINGS: Increased lung volumes with fibrotic changes, architectural distortion and patchy consolidation, hilar retraction. No pleural effusion. No pneumothorax. Cardiomediastinal silhouette is normal. Soft tissue planes included osseous structures are nonsuspicious. IMPRESSION: Similar to worsening chronic interstitial changes and patchy consolidation seen with sarcoidosis though a component of pneumonia not excluded. Stable hyperinflation. Electronically Signed   By: Awilda Metroourtnay  Bloomer M.D.   On: 11/05/2017 17:56    Procedures Procedures (including critical care time)  Medications Ordered in ED Medications  albuterol (PROVENTIL) (2.5 MG/3ML) 0.083% nebulizer solution 5 mg (5 mg Nebulization Given 11/05/17 1557)  albuterol (PROVENTIL) (2.5 MG/3ML) 0.083% nebulizer solution 5 mg (5 mg Nebulization Given 11/05/17 1648)  predniSONE (DELTASONE) tablet 60 mg (60 mg Oral Given 11/05/17 1648)     Initial Impression / Assessment and Plan / ED Course  I have reviewed the triage vital signs and the nursing notes.  Pertinent labs & imaging results that were available during my care of the patient were reviewed by me and considered in my medical decision making (see chart for  details).     BP (!) 143/101   Pulse (!) 101   Temp 98.2 F (36.8 C) (Oral)   Resp (!) 31   SpO2 100%    Final Clinical Impressions(s) / ED Diagnoses   Final diagnoses:  COPD exacerbation (HCC)  Community acquired pneumonia, unspecified laterality    ED Discharge Orders    None     8:03 PM Patient here with productive cough, wheezing, and increased shortness of breath.  History of COPD.  History of prior pneumonia.  Symptoms not improved despite using home rescue inhaler and nebulizer.  He is actively wheezing.  Patient received 2 doses of albuterol as well as 60 mg of prednisone prior to my evaluation but shows no improvement.  Does have an elevated white count of 15.1, and a chest x-ray that shows chronic interstitial changes and patchy consolidation seen with sarcoidosis although a component  of pneumonia is not excluded.  Since patient has prior pneumonia and does have productive cough with leukocytosis, he will be treated for suspected community acquired pneumonia with Levaquin via IV.  We will continue with breathing treatment.  Will have patient ambulate to check oxygen status.  8:23 PM No hypoxia with ambulation.  However, pt does have a leukocytosis and an elevated lactic acid of 2.15.  Leukocytosis may be due to recent steroid use, pt is tachycardic, may be due to recent albuterol use.  However, IVF given, and will continue to monitor.  Care discussed with DR. Yelverton.   9:56 PM Pt report improvement of sxs with current treatment.  Given no hypoxia with ambulation and after discussion, pt prefers going home with abx, prednisone and cough medication.  He has received steroid already from recent visit.  Will d/c with Levaquin.  He will return to the ER if sxs worsen despite treatment.     Fayrene Helper, PA-C 11/05/17 2202    Loren Racer, MD 11/05/17 2218

## 2017-11-05 NOTE — ED Notes (Signed)
Ambulated Pt around the nurses station. Pt had steady gait.  Pt initial SPO2 went up to 98% and then fell down to 95% but then stayed consistent at 95% for the entire walk.

## 2017-11-05 NOTE — Discharge Instructions (Signed)
Please take Levaquin as prescribed for your lung infection.  You can use our discount code at [facility] for a discount price of the medication.  Take steroid previously prescribed.  Return if you notice no improvement or if you have other concerns.

## 2017-11-06 ENCOUNTER — Encounter (HOSPITAL_COMMUNITY): Payer: Self-pay | Admitting: Emergency Medicine

## 2017-11-06 ENCOUNTER — Inpatient Hospital Stay (HOSPITAL_COMMUNITY): Payer: Self-pay

## 2017-11-06 ENCOUNTER — Emergency Department (HOSPITAL_COMMUNITY): Payer: Self-pay

## 2017-11-06 ENCOUNTER — Inpatient Hospital Stay (HOSPITAL_COMMUNITY)
Admission: EM | Admit: 2017-11-06 | Discharge: 2017-11-10 | DRG: 190 | Disposition: A | Discharge status: Home / Self Care | Payer: Self-pay | Attending: Internal Medicine | Admitting: Internal Medicine

## 2017-11-06 DIAGNOSIS — F17210 Nicotine dependence, cigarettes, uncomplicated: Secondary | ICD-10-CM | POA: Diagnosis present

## 2017-11-06 DIAGNOSIS — R7989 Other specified abnormal findings of blood chemistry: Secondary | ICD-10-CM

## 2017-11-06 DIAGNOSIS — I10 Essential (primary) hypertension: Secondary | ICD-10-CM | POA: Diagnosis present

## 2017-11-06 DIAGNOSIS — Z720 Tobacco use: Secondary | ICD-10-CM | POA: Diagnosis present

## 2017-11-06 DIAGNOSIS — Z23 Encounter for immunization: Secondary | ICD-10-CM

## 2017-11-06 DIAGNOSIS — J9621 Acute and chronic respiratory failure with hypoxia: Secondary | ICD-10-CM | POA: Diagnosis present

## 2017-11-06 DIAGNOSIS — I159 Secondary hypertension, unspecified: Secondary | ICD-10-CM

## 2017-11-06 DIAGNOSIS — R0602 Shortness of breath: Secondary | ICD-10-CM

## 2017-11-06 DIAGNOSIS — R0603 Acute respiratory distress: Secondary | ICD-10-CM | POA: Diagnosis present

## 2017-11-06 DIAGNOSIS — R000 Tachycardia, unspecified: Secondary | ICD-10-CM | POA: Diagnosis present

## 2017-11-06 DIAGNOSIS — J441 Chronic obstructive pulmonary disease with (acute) exacerbation: Principal | ICD-10-CM | POA: Diagnosis present

## 2017-11-06 DIAGNOSIS — F1410 Cocaine abuse, uncomplicated: Secondary | ICD-10-CM | POA: Diagnosis present

## 2017-11-06 DIAGNOSIS — J189 Pneumonia, unspecified organism: Secondary | ICD-10-CM | POA: Diagnosis present

## 2017-11-06 DIAGNOSIS — J9602 Acute respiratory failure with hypercapnia: Secondary | ICD-10-CM

## 2017-11-06 DIAGNOSIS — Z7951 Long term (current) use of inhaled steroids: Secondary | ICD-10-CM

## 2017-11-06 DIAGNOSIS — Z79899 Other long term (current) drug therapy: Secondary | ICD-10-CM

## 2017-11-06 DIAGNOSIS — E872 Acidosis: Secondary | ICD-10-CM | POA: Diagnosis present

## 2017-11-06 DIAGNOSIS — D72829 Elevated white blood cell count, unspecified: Secondary | ICD-10-CM | POA: Diagnosis present

## 2017-11-06 DIAGNOSIS — J9622 Acute and chronic respiratory failure with hypercapnia: Secondary | ICD-10-CM | POA: Diagnosis present

## 2017-11-06 LAB — I-STAT CG4 LACTIC ACID, ED
LACTIC ACID, VENOUS: 2.62 mmol/L — AB (ref 0.5–1.9)
Lactic Acid, Venous: 1.37 mmol/L (ref 0.5–1.9)

## 2017-11-06 LAB — RAPID URINE DRUG SCREEN, HOSP PERFORMED
AMPHETAMINES: NOT DETECTED
BARBITURATES: NOT DETECTED
Benzodiazepines: NOT DETECTED
COCAINE: POSITIVE — AB
Opiates: POSITIVE — AB
TETRAHYDROCANNABINOL: NOT DETECTED

## 2017-11-06 LAB — CBC
HCT: 38.2 % — ABNORMAL LOW (ref 39.0–52.0)
HEMOGLOBIN: 12.3 g/dL — AB (ref 13.0–17.0)
MCH: 22 pg — AB (ref 26.0–34.0)
MCHC: 32.2 g/dL (ref 30.0–36.0)
MCV: 68.5 fL — ABNORMAL LOW (ref 78.0–100.0)
Platelets: 240 10*3/uL (ref 150–400)
RBC: 5.58 MIL/uL (ref 4.22–5.81)
RDW: 19.9 % — ABNORMAL HIGH (ref 11.5–15.5)
WBC: 13.3 10*3/uL — ABNORMAL HIGH (ref 4.0–10.5)

## 2017-11-06 LAB — I-STAT ARTERIAL BLOOD GAS, ED
ACID-BASE DEFICIT: 4 mmol/L — AB (ref 0.0–2.0)
ACID-BASE EXCESS: 2 mmol/L (ref 0.0–2.0)
Bicarbonate: 24.9 mmol/L (ref 20.0–28.0)
Bicarbonate: 29.3 mmol/L — ABNORMAL HIGH (ref 20.0–28.0)
O2 SAT: 100 %
O2 Saturation: 99 %
PH ART: 7.344 — AB (ref 7.350–7.450)
PO2 ART: 176 mmHg — AB (ref 83.0–108.0)
TCO2: 27 mmol/L (ref 22–32)
TCO2: 31 mmol/L (ref 22–32)
pCO2 arterial: 59.2 mmHg — ABNORMAL HIGH (ref 32.0–48.0)
pCO2 arterial: 53.7 mmHg — ABNORMAL HIGH (ref 32.0–48.0)
pH, Arterial: 7.232 — ABNORMAL LOW (ref 7.350–7.450)
pO2, Arterial: 188 mmHg — ABNORMAL HIGH (ref 83.0–108.0)

## 2017-11-06 LAB — TSH: TSH: 0.429 u[IU]/mL (ref 0.350–4.500)

## 2017-11-06 LAB — BASIC METABOLIC PANEL
ANION GAP: 10 (ref 5–15)
BUN: 15 mg/dL (ref 6–20)
CALCIUM: 8.8 mg/dL — AB (ref 8.9–10.3)
CO2: 23 mmol/L (ref 22–32)
Chloride: 103 mmol/L (ref 101–111)
Creatinine, Ser: 1.02 mg/dL (ref 0.61–1.24)
GFR calc non Af Amer: >60 mL/min (ref >60)
GLUCOSE: 129 mg/dL — AB (ref 65–99)
POTASSIUM: 4.2 mmol/L (ref 3.5–5.1)
Sodium: 136 mmol/L (ref 135–145)

## 2017-11-06 LAB — BRAIN NATRIURETIC PEPTIDE: B NATRIURETIC PEPTIDE 5: 57.5 pg/mL (ref 0.0–100.0)

## 2017-11-06 LAB — HIV ANTIBODY (ROUTINE TESTING W REFLEX): HIV SCREEN 4TH GENERATION: NONREACTIVE

## 2017-11-06 MED ORDER — ALBUTEROL SULFATE (2.5 MG/3ML) 0.083% IN NEBU: 2.5000 mg | INHALATION_SOLUTION | RESPIRATORY_TRACT | Status: DC | PRN

## 2017-11-06 MED ORDER — ENOXAPARIN SODIUM 40 MG/0.4ML ~~LOC~~ SOLN
40.0000 mg | Freq: Every day | SUBCUTANEOUS | Status: DC
  Filled 2017-11-06 (×3): qty 0.4

## 2017-11-06 MED ORDER — IPRATROPIUM-ALBUTEROL 0.5-2.5 (3) MG/3ML IN SOLN
3.0000 mL | RESPIRATORY_TRACT | Status: DC
  Administered 2017-11-06 – 2017-11-10 (×26): 3 mL via RESPIRATORY_TRACT
  Filled 2017-11-06 (×25): qty 3

## 2017-11-06 MED ORDER — ENOXAPARIN SODIUM 40 MG/0.4ML ~~LOC~~ SOLN: 40.0000 mg | Freq: Every day | SUBCUTANEOUS | Status: DC

## 2017-11-06 MED ORDER — FUROSEMIDE 10 MG/ML IJ SOLN
40.0000 mg | Freq: Once | INTRAMUSCULAR | Status: AC
  Administered 2017-11-06: 40 mg via INTRAVENOUS
  Filled 2017-11-06: qty 4

## 2017-11-06 MED ORDER — ACETAMINOPHEN 650 MG RE SUPP: 650.0000 mg | Freq: Four times a day (QID) | RECTAL | Status: DC | PRN

## 2017-11-06 MED ORDER — ALBUTEROL SULFATE (2.5 MG/3ML) 0.083% IN NEBU
5.0000 mg | INHALATION_SOLUTION | Freq: Once | RESPIRATORY_TRACT | Status: AC
  Administered 2017-11-06: 5 mg via RESPIRATORY_TRACT
  Filled 2017-11-06: qty 6

## 2017-11-06 MED ORDER — ALBUTEROL (5 MG/ML) CONTINUOUS INHALATION SOLN
15.0000 mg/h | INHALATION_SOLUTION | Freq: Once | RESPIRATORY_TRACT | Status: AC
  Administered 2017-11-06: 15 mg/h via RESPIRATORY_TRACT
  Filled 2017-11-06: qty 20

## 2017-11-06 MED ORDER — LEVOFLOXACIN IN D5W 500 MG/100ML IV SOLN
500.0000 mg | INTRAVENOUS | Status: DC
Start: 2017-11-06 — End: 2017-11-07
  Administered 2017-11-06: 500 mg via INTRAVENOUS
  Filled 2017-11-06 (×2): qty 100

## 2017-11-06 MED ORDER — LORAZEPAM 2 MG/ML IJ SOLN
0.5000 mg | Freq: Once | INTRAMUSCULAR | Status: AC
  Administered 2017-11-06: 0.5 mg via INTRAVENOUS
  Filled 2017-11-06: qty 1

## 2017-11-06 MED ORDER — ENOXAPARIN SODIUM 40 MG/0.4ML ~~LOC~~ SOLN
40.0000 mg | Freq: Every day | SUBCUTANEOUS | Status: DC
  Administered 2017-11-06 (×2): 40 mg via SUBCUTANEOUS
  Filled 2017-11-06: qty 0.4

## 2017-11-06 MED ORDER — SENNOSIDES-DOCUSATE SODIUM 8.6-50 MG PO TABS
1.0000 | ORAL_TABLET | Freq: Every evening | ORAL | Status: DC | PRN
  Filled 2017-11-06: qty 1

## 2017-11-06 MED ORDER — ONDANSETRON HCL 4 MG/2ML IJ SOLN: 4.0000 mg | Freq: Four times a day (QID) | INTRAMUSCULAR | Status: DC | PRN

## 2017-11-06 MED ORDER — METHYLPREDNISOLONE SODIUM SUCC 125 MG IJ SOLR
60.0000 mg | Freq: Four times a day (QID) | INTRAMUSCULAR | Status: DC
  Administered 2017-11-06 – 2017-11-07 (×3): 60 mg via INTRAVENOUS
  Filled 2017-11-06: qty 2
  Filled 2017-11-06 (×2): qty 0.96

## 2017-11-06 MED ORDER — SODIUM CHLORIDE 0.9 % IV BOLUS (SEPSIS)
250.0000 mL | Freq: Once | INTRAVENOUS | Status: AC
  Administered 2017-11-06: 250 mL via INTRAVENOUS

## 2017-11-06 MED ORDER — ONDANSETRON HCL 4 MG PO TABS: 4.0000 mg | ORAL_TABLET | Freq: Four times a day (QID) | ORAL | Status: DC | PRN

## 2017-11-06 MED ORDER — GUAIFENESIN ER 600 MG PO TB12
600.0000 mg | ORAL_TABLET | Freq: Two times a day (BID) | ORAL | Status: DC
  Administered 2017-11-07 (×2): 600 mg via ORAL
  Filled 2017-11-06 (×3): qty 1

## 2017-11-06 MED ORDER — SODIUM CHLORIDE 0.9 % IV SOLN
INTRAVENOUS | Status: DC
  Administered 2017-11-06 (×2): via INTRAVENOUS

## 2017-11-06 MED ORDER — ACETAMINOPHEN 325 MG PO TABS
650.0000 mg | ORAL_TABLET | Freq: Four times a day (QID) | ORAL | Status: DC | PRN
  Administered 2017-11-08 – 2017-11-10 (×5): 650 mg via ORAL
  Filled 2017-11-06 (×6): qty 2

## 2017-11-06 MED ORDER — SODIUM CHLORIDE 0.9 % IV BOLUS (SEPSIS)
500.0000 mL | Freq: Once | INTRAVENOUS | Status: AC
  Administered 2017-11-06: 500 mL via INTRAVENOUS

## 2017-11-06 MED ORDER — MORPHINE SULFATE (PF) 4 MG/ML IV SOLN
1.0000 mg | Freq: Once | INTRAVENOUS | Status: AC
  Administered 2017-11-06: 1 mg via INTRAVENOUS
  Filled 2017-11-06: qty 1

## 2017-11-06 MED ORDER — IPRATROPIUM BROMIDE 0.02 % IN SOLN
0.5000 mg | Freq: Once | RESPIRATORY_TRACT | Status: AC
  Administered 2017-11-06: 0.5 mg via RESPIRATORY_TRACT
  Filled 2017-11-06: qty 2.5

## 2017-11-06 MED ORDER — SODIUM CHLORIDE 0.9 % IV BOLUS (SEPSIS)
1000.0000 mL | Freq: Once | INTRAVENOUS | Status: AC
  Administered 2017-11-06: 1000 mL via INTRAVENOUS

## 2017-11-06 NOTE — ED Notes (Signed)
Respiratory called by this RN to complete blood gas

## 2017-11-06 NOTE — ED Notes (Addendum)
Pharmacy messaged for missing dose of levaquin.

## 2017-11-06 NOTE — ED Notes (Signed)
Critical care at bedside  

## 2017-11-06 NOTE — ED Notes (Signed)
Spoke with admitting MD. Expressed my concerns for pt. Work of breathing. Orders placed. Will continue to monitor.

## 2017-11-06 NOTE — Progress Notes (Signed)
Patient stated "I feel like I'm going to be sick" Patient was taken off BIPAP & placed on 4L [place]. Patient tolerated 4L [place] for about 5 minutes before requesting to be placed back on BIPAP due to difficulty breathing. CCM MD was called & made aware. Patient states that he isn't feeling sick anymore. RN, ED MD are also aware.

## 2017-11-06 NOTE — ED Notes (Signed)
Respiratory called for arterial gas.

## 2017-11-06 NOTE — ED Notes (Signed)
Pt asking to come off of Bipap, pt informed that his respiratory rate and heart rate are too high at this time to come off. Pt voiced understanding.

## 2017-11-06 NOTE — ED Notes (Addendum)
Increased work of breathing noted when assessing pt. Pt. Is belly breathing, and nasal flaring. Pt. Able to speak a couple words at a time. MD paged.

## 2017-11-06 NOTE — ED Notes (Signed)
Admitting at bedside 

## 2017-11-06 NOTE — ED Notes (Signed)
Spoke with admitting MD. Pt. Still showing work of breathing and MD made aware. MD stated he would follow up with critical care.

## 2017-11-06 NOTE — H&P (Signed)
History and Physical    Devin Lopez ZOX:[id number] DOB: [birth year] DOA: 11/06/2017  PCP: Patient, No Pcp Per Patient coming from: home  Chief Complaint: dyspnea  HPI: Devin Lopez is a 47 y.o. male with medical history significant tobacco use COPD not on home oxygen presents to the emergency Department chief complaint worsening persistent shortness of breath. Initial evaluation reveals acute respiratory distress chest x-ray with emphysema and chronic interstitial scarring, tachycardia tachypnea leukocytosis. [facility] are asked to admit  Information is obtained from the patient and the chart. He developed worsening shortness of breath about 3 days ago. He states he went to [facility] received treatment and was discharged. He reports he did not improve. He came to [facility] emergency department yesterday was provided with steroids nebulizers and discharged with improved symptoms. During the night he got worse was unable to breathe so he came back. He states he does use nebulizers at home 4 times a day but has not gotten any relief. He was provided with a dose of Levaquin yesterday and a prescription but has not had that filled. He denies chest pain palpitations headache dizziness syncope or near-syncope. He denies abdominal pain nausea vomiting lower extremity edema. He does report intermittent productive cough with thick yellow sputum. He denies fever chills recent travel or sick contacts. He denies dysuria hematuria frequency or urgency.   ED Course: In the emergency department he's hypertensive tachycardia with tachypnea on continuous nebulizers. He has been provided with nebulizers and Solu-Medrol prior to arrival. Oxygen saturation levels 91%. He has severe increased work of breathing with quite diminished breath sounds  Review of Systems: As per HPI otherwise all other systems reviewed and are negative.   Ambulatory Status: Ambulates independently is independent with  ADLs  Past Medical History:  Diagnosis Date  . COPD (chronic obstructive pulmonary disease) (HCC)   . Pneumonia     Past Surgical History:  Procedure Laterality Date  . FINGER SURGERY      Social History   Socioeconomic History  . Marital status: Single    Spouse name: Not on file  . Number of children: Not on file  . Years of education: Not on file  . Highest education level: Not on file  Social Needs  . Financial resource strain: Not on file  . Food insecurity - worry: Not on file  . Food insecurity - inability: Not on file  . Transportation needs - medical: Not on file  . Transportation needs - non-medical: Not on file  Occupational History  . Not on file  Tobacco Use  . Smoking status: Current Every Day Smoker    Packs/day: 0.00    Types: Cigarettes  . Smokeless tobacco: Never Used  Substance and Sexual Activity  . Alcohol use: Yes  . Drug use: Yes    Types: Cocaine, Marijuana  . Sexual activity: Not on file  Other Topics Concern  . Not on file  Social History Narrative  . Not on file    No Known Allergies  Family History  Problem Relation Age of Onset  . Diabetes Mother   . Aneurysm Mother   . Diabetes Maternal Grandmother     Prior to Admission medications   Medication Sig Start Date End Date Taking? Authorizing Provider  albuterol (PROVENTIL HFA;VENTOLIN HFA) 108 (90 Base) MCG/ACT inhaler Inhale 1-2 puffs into the lungs every 6 (six) hours as needed for wheezing or shortness of breath. 02/03/17  Yes Quentin Angst, MD  albuterol (PROVENTIL) (2.5  MG/3ML) 0.083% nebulizer solution Take 3 mLs (2.5 mg total) by nebulization every 6 (six) hours as needed for wheezing or shortness of breath. 03/04/17  Yes Raeford RazorKohut, Stephen, MD  guaiFENesin (MUCINEX) 600 MG 12 hr tablet Take 600 mg by mouth 2 (two) times daily as needed for cough.   Yes [provider]  ibuprofen (ADVIL,MOTRIN) 600 MG tablet Take 1 tablet (600 mg total) by mouth every 6 (six) hours  as needed. Patient taking differently: Take 600 mg by mouth every 6 (six) hours as needed for moderate pain.  02/04/17  Yes Cardama, Amadeo GarnetPedro Eduardo, MD  SPIRIVA HANDIHALER 18 MCG inhalation capsule Place 18 mcg into inhaler and inhale daily. 10/24/17  Yes [provider]  SYMBICORT 160-4.5 MCG/ACT inhaler Inhale 2 puffs into the lungs 2 (two) times daily. 10/24/17  Yes [provider]  levofloxacin (LEVAQUIN) 500 MG tablet Take 1 tablet (500 mg total) by mouth daily. 11/05/17   Fayrene Helperran, [place], PA-C  nicotine (NICODERM CQ - DOSED IN MG/24 HOURS) 21 mg/24hr patch Place 1 patch (21 mg total) onto the skin daily. Patient not taking: Reported on 11/05/2017 04/13/17   Corena HerterMumma, Shannon, MD    Physical Exam: Vitals:   11/06/17 0502 11/06/17 0503 11/06/17 0530 11/06/17 0600  BP: (!) 155/115  (!) 172/108 (!) 153/99  Pulse: (!) 108  (!) 114 (!) 116  Resp: (!) 22  (!) 33 (!) 30  Temp: 97.9 F (36.6 C)     TempSrc: Oral     SpO2: 100%  100% 98%  Weight:  54.4 kg (120 lb)    Height:  5\' 5"  (1.651 m)       General:  Appears slightly anxious sitting up in bed moderate to severe increased work of breathing moderate distress Eyes:  PERRL, EOMI, normal lids, iris ENT:  grossly normal hearing, lips & tongue, mucous membranes of his mouth are dry but pink Neck:  no LAD, masses or thyromegaly Cardiovascular:  Tachycardic but regular, no m/r/g. No LE edema.  Respiratory:  Severe increased work of breathing use of accessory muscles. Sounds quite diminished throughout particularly in the bases diffuse end expiratory wheezes some faint rhonchi Abdomen:  soft, ntnd, positive bowel sounds throughout no guarding or rebounding Skin:  no rash or induration seen on limited exam Musculoskeletal:  grossly normal tone BUE/BLE, good ROM, no bony abnormality Psychiatric:  grossly normal mood and affect, speech fluent and appropriate, AOx3 Neurologic:  CN 2-12 grossly intact, moves all extremities in coordinated  fashion, sensation intact alert and oriented 3  Labs on Admission: I have personally reviewed following labs and imaging studies  CBC: Recent Labs  Lab 11/05/17 1614  WBC 15.1*  HGB 13.1  HCT 41.5  MCV 69.1*  PLT 280   Basic Metabolic Panel: Recent Labs  Lab 11/05/17 1614 11/06/17 0509  NA 140 136  K 4.4 4.2  CL 105 103  CO2 28 23  GLUCOSE 115* 129*  BUN 15 15  CREATININE 1.20 1.02  CALCIUM 9.3 8.8*   GFR: Estimated Creatinine Clearance: 68.9 mL/min (by C-G formula based on SCr of 1.02 mg/dL). Liver Function Tests: No results for input(s): AST, ALT, ALKPHOS, BILITOT, PROT, ALBUMIN in the last 168 hours. No results for input(s): LIPASE, AMYLASE in the last 168 hours. No results for input(s): AMMONIA in the last 168 hours. Coagulation Profile: No results for input(s): INR, PROTIME in the last 168 hours. Cardiac Enzymes: No results for input(s): CKTOTAL, CKMB, CKMBINDEX, TROPONINI in the last 168  hours. BNP (last 3 results) No results for input(s): PROBNP in the last 8760 hours. HbA1C: No results for input(s): HGBA1C in the last 72 hours. CBG: No results for input(s): GLUCAP in the last 168 hours. Lipid Profile: No results for input(s): CHOL, HDL, LDLCALC, TRIG, CHOLHDL, LDLDIRECT in the last 72 hours. Thyroid Function Tests: No results for input(s): TSH, T4TOTAL, FREET4, T3FREE, THYROIDAB in the last 72 hours. Anemia Panel: No results for input(s): VITAMINB12, FOLATE, FERRITIN, TIBC, IRON, RETICCTPCT in the last 72 hours. Urine analysis:    Component Value Date/Time   COLORURINE YELLOW 02/04/2017 1312   APPEARANCEUR HAZY (A) 02/04/2017 1312   LABSPEC 1.015 02/04/2017 1312   PHURINE 6.0 02/04/2017 1312   GLUCOSEU NEGATIVE 02/04/2017 1312   HGBUR MODERATE (A) 02/04/2017 1312   BILIRUBINUR small 02/06/2017 0959   KETONESUR NEGATIVE 02/04/2017 1312   PROTEINUR 30 02/06/2017 0959   PROTEINUR NEGATIVE 02/04/2017 1312   UROBILINOGEN 0.2 02/06/2017 0959    NITRITE negative 02/06/2017 0959   NITRITE NEGATIVE 02/04/2017 1312   LEUKOCYTESUR Small (1+) (A) 02/06/2017 0959    Creatinine Clearance: Estimated Creatinine Clearance: 68.9 mL/min (by C-G formula based on SCr of 1.02 mg/dL).  Sepsis Labs: @LABRCNTIP (procalcitonin:4,lacticidven:4) )No results found for this or any previous visit (from the past 240 hour(s)).   Radiological Exams on Admission: Dg Chest 2 View  Result Date: 11/05/2017 CLINICAL DATA:  Shortness of breath and wheezing since yesterday. Discharged from hospital yesterday. History of COPD and pneumonia. EXAM: CHEST  2 VIEW COMPARISON:  CT chest June 06, 2017 and chest radiograph April 13, 2017 FINDINGS: Increased lung volumes with fibrotic changes, architectural distortion and patchy consolidation, hilar retraction. No pleural effusion. No pneumothorax. Cardiomediastinal silhouette is normal. Soft tissue planes included osseous structures are nonsuspicious. IMPRESSION: Similar to worsening chronic interstitial changes and patchy consolidation seen with sarcoidosis though a component of pneumonia not excluded. Stable hyperinflation. Electronically Signed   By: Awilda Metro M.D.   On: 11/05/2017 17:56   Dg Chest Port 1 View  Result Date: 11/06/2017 CLINICAL DATA:  46 year old male with shortness of breath. History of COPD. EXAM: PORTABLE CHEST 1 VIEW COMPARISON:  Chest radiograph dated 11/05/2017 FINDINGS: There is emphysematous changes of the lungs with interstitial coarsening and areas of scarring. No focal consolidation, pleural effusion, or pneumothorax. The cardiac silhouette is within normal limits no acute osseous pathology. IMPRESSION: Emphysema with chronic interstitial coarsening and scarring. No focal consolidation. Electronically Signed   By: Elgie Collard M.D.   On: 11/06/2017 06:04    EKG: Independently reviewed. Sinus tachycardia Biatrial enlargement Anterolateral infarct, old Artifact in lead(s) II III aVR  aVF  Assessment/Plan Principal Problem:   Acute respiratory distress Active Problems:   COPD exacerbation (HCC)   Tachycardia   Hypertension   Tobacco use   #1. Acute respiratory distress likely related to COPD exacerbation that failed outpatient therapy. Chest x-ray reveals emphysema with chronic interstitial coarsening and scarring no consolidation. Patient with tachypnea and tachycardia. Not hypoxic at this time. Last 2 days he's received steroids 2 and antibiotics 1 with multiple nebulizer treatments outpatient.  -Admit to step down as inpatient -Obtain arterial blood gas -Provide BiPAP -Continue nebulizer -Continue steroids -levaquin -Track lactic acid -Monitor oxygen saturation level -wean bipap as able  #2. COPD exacerbation. Patient current smoker. Home medications include albuterol nebs, spiriva, Symbicort -BiPAP as noted above -Steroids as noted above -scheduled nebs -Monitor oxygen saturation level -resume home meds as indicated -likely benefit from pulmonary follow up op  3. Tachycardia. Likely related to continuous nebs. He does have a mildly elevated lactic acid and a mild leukocytosis likely related steroids. Provided with fluid resuscitation. EKG as noted above -Continue on monitor -Continue IV fluids -See #1  #4. Leukocytosis. WBCs 15. Patient's afebrile with a blood pressure of the high end of normal. I suspect this is related to steroids provided yesterday and 2 days ago -Monitor  #5. Hypertension. Patient without a history of same. Likely related to above. On admission blood pressure 153/99 -When necessary hydralazine -Monitor  #6. Tobacco use -cessation counseling  DVT prophylaxis: lovenox Code Status: full  Family Communication: none present  Disposition Plan: home  Consults called: none  Admission status: inpatient    Gwenyth Bender MD [facility]  If 7PM-7AM, please contact night-coverage [email or web address] Password  [facility]  11/06/2017, 6:52 AM

## 2017-11-06 NOTE — ED Notes (Signed)
BIPAP applied by RT .  

## 2017-11-06 NOTE — ED Triage Notes (Signed)
Patient arrived with EMS from home reports worsening SOB with dry cough , wheezing rales , rhonchi and crakles on both lung fields onset this evening , he received Solumedrol 125 mg IV , Magnesium Sulfate 2 grams IV and Albuterol 5 mg nebulizer by EMS prior to arrival with mild relief , denies fever or chills .

## 2017-11-06 NOTE — ED Notes (Signed)
This RN at bedside with pt, pt still has increased work of breathing and appears tired. Pt asked this RN if he could get up and walk, this RN explained that he could not due to breathing and heart rate, pt voiced understanding.

## 2017-11-06 NOTE — ED Notes (Signed)
Rapid response nurse at bedside to evaluate pt.

## 2017-11-06 NOTE — ED Provider Notes (Signed)
[facility] EMERGENCY DEPARTMENT Provider Note   CSN: [id number] Arrival date & time: 11/06/17  0454     History   Chief Complaint Chief Complaint  Patient presents with  . Shortness of Breath    COPD    HPI Devin Lopez is a 47 y.o. male.  The history is provided by the patient.  He has a history of COPD and comes in with difficulty breathing which started 2-3 days ago.  He had been in the emergency department last night and had been discharged and was only home for a few hours before his breathing got worse.  He states he was only marginally better on discharge.  There is a nonproductive cough.  He has had subjective fever along with chills and sweats.  He denies arthralgias or myalgias.  He is a cigarette smoker, but states he has not smoked in the last 2 days.  Of note, he was also seen in the emergency department at [facility] 2 days ago.  He states that he was only minimally improved when he left [facility].  He has received a nebulizer treatment in the ambulance and one in the ED with only minimal improvement.    Past Medical History:  Diagnosis Date  . COPD (chronic obstructive pulmonary disease) (HCC)   . Pneumonia     There are no active problems to display for this patient.   Past Surgical History:  Procedure Laterality Date  . FINGER SURGERY         Home Medications    Prior to Admission medications   Medication Sig Start Date End Date Taking? Authorizing Provider  albuterol (PROVENTIL HFA;VENTOLIN HFA) 108 (90 Base) MCG/ACT inhaler Inhale 1-2 puffs into the lungs every 6 (six) hours as needed for wheezing or shortness of breath. 02/03/17   Quentin Angst, MD  albuterol (PROVENTIL) (2.5 MG/3ML) 0.083% nebulizer solution Take 3 mLs (2.5 mg total) by nebulization every 6 (six) hours as needed for wheezing or shortness of breath. 03/04/17   Raeford Razor, MD  guaiFENesin (MUCINEX) 600 MG 12 hr tablet Take 600 mg by mouth 2  (two) times daily as needed for cough.    [provider]  ibuprofen (ADVIL,MOTRIN) 600 MG tablet Take 1 tablet (600 mg total) by mouth every 6 (six) hours as needed. Patient taking differently: Take 600 mg by mouth every 6 (six) hours as needed for moderate pain.  02/04/17   [name], MD  levofloxacin (LEVAQUIN) 500 MG tablet Take 1 tablet (500 mg total) by mouth daily. 11/05/17   Fayrene Helper, PA-C  nicotine (NICODERM CQ - DOSED IN MG/24 HOURS) 21 mg/24hr patch Place 1 patch (21 mg total) onto the skin daily. Patient not taking: Reported on 11/05/2017 04/13/17   Corena Herter, MD  SPIRIVA HANDIHALER 18 MCG inhalation capsule Place 18 mcg into inhaler and inhale daily. 10/24/17   [provider]  SYMBICORT 160-4.5 MCG/ACT inhaler Inhale 2 puffs into the lungs 2 (two) times daily. 10/24/17   [provider]    Family History Family History  Problem Relation Age of Onset  . Diabetes Mother   . Aneurysm Mother   . Diabetes Maternal Grandmother     Social History Social History   Tobacco Use  . Smoking status: Current Every Day Smoker    Packs/day: 0.00    Types: Cigarettes  . Smokeless tobacco: Never Used  Substance Use Topics  . Alcohol use: Yes  . Drug use: Yes  Types: Cocaine, Marijuana     Allergies   Patient has no known allergies.   Review of Systems Review of Systems  All other systems reviewed and are negative.    Physical Exam Updated Vital Signs BP (!) 155/115 (BP Location: Right Arm)   Pulse (!) 108   Temp 97.9 F (36.6 C) (Oral)   Resp (!) 22   Ht 5\' 5"  (1.651 m)   Wt 54.4 kg (120 lb)   SpO2 100%   BMI 19.97 kg/m   Physical Exam  Nursing note and vitals reviewed.  47 year old male, in mild to moderate respiratory distress.  He is using his accessory muscles of respiration, but can speak in complete sentences without stopping to take a breath.  Vital signs are significant for hypertension, tachycardia, tachypnea.  Oxygen saturation is 94%, which is normal. Head is normocephalic and atraumatic. PERRLA, EOMI. Oropharynx is clear. Neck is nontender and supple without adenopathy or JVD. Back is nontender and there is no CVA tenderness. Lungs have coarse inspiratory and expiratory wheezes and rhonchi as well as scattered rales. Chest is nontender. Heart has regular rate and rhythm without murmur. Abdomen is soft, flat, nontender without masses or hepatosplenomegaly and peristalsis is normoactive. Extremities have no cyanosis or edema, full range of motion is present. Skin is warm and dry without rash. Neurologic: Mental status is normal, cranial nerves are intact, there are no motor or sensory deficits.  ED Treatments / Results  Labs (all labs ordered are listed, but only abnormal results are displayed) Labs Reviewed  BASIC METABOLIC PANEL - Abnormal; Notable for the following components:      Result Value   Glucose, Bld 129 (*)    Calcium 8.8 (*)    All other components within normal limits  CBC - Abnormal; Notable for the following components:   WBC 13.3 (*)    Hemoglobin 12.3 (*)    HCT 38.2 (*)    MCV 68.5 (*)    MCH 22.0 (*)    RDW 19.9 (*)    All other components within normal limits  I-STAT CG4 LACTIC ACID, ED - Abnormal; Notable for the following components:   Lactic Acid, Venous 2.62 (*)    All other components within normal limits  CULTURE, BLOOD (ROUTINE X 2)  CULTURE, BLOOD (ROUTINE X 2)  BRAIN NATRIURETIC PEPTIDE  HIV ANTIBODY (ROUTINE TESTING)  TSH  I-STAT CG4 LACTIC ACID, ED  I-STAT ARTERIAL BLOOD GAS, ED    EKG  EKG Interpretation  Date/Time:  Thursday November 06 2017 05:01:52 EST Ventricular Rate:  112 PR Interval:    QRS Duration: 87 QT Interval:  347 QTC Calculation: 474 R Axis:   93 Text Interpretation:  Sinus tachycardia Biatrial enlargement Anterolateral infarct, old Artifact in lead(s) II III aVR aVF When compared with ECG of 11/05/2017, No significant change  was found Confirmed by Dione Booze ([id number]) on 11/06/2017 5:29:50 AM Also confirmed by Dione Booze ([id number]), editor Elita Quick [phone])  on 11/06/2017 6:50:38 AM       Radiology Dg Chest 2 View  Result Date: 11/05/2017 CLINICAL DATA:  Shortness of breath and wheezing since yesterday. Discharged from hospital yesterday. History of COPD and pneumonia. EXAM: CHEST  2 VIEW COMPARISON:  CT chest June 06, 2017 and chest radiograph April 13, 2017 FINDINGS: Increased lung volumes with fibrotic changes, architectural distortion and patchy consolidation, hilar retraction. No pleural effusion. No pneumothorax. Cardiomediastinal silhouette is normal. Soft tissue planes included osseous structures are nonsuspicious. IMPRESSION:  Similar to worsening chronic interstitial changes and patchy consolidation seen with sarcoidosis though a component of pneumonia not excluded. Stable hyperinflation. Electronically Signed   By: Awilda Metroourtnay  Bloomer M.D.   On: 11/05/2017 17:56    Procedures Procedures  CRITICAL CARE Performed by: Dione Boozeavid Jamaul Heist Total critical care time: 85 minutes Critical care time was exclusive of separately billable procedures and treating other patients. Critical care was necessary to treat or prevent imminent or life-threatening deterioration. Critical care was time spent personally by me on the following activities: development of treatment plan with patient and/or surrogate as well as nursing, discussions with consultants, evaluation of patient's response to treatment, examination of patient, obtaining history from patient or surrogate, ordering and performing treatments and interventions, ordering and review of laboratory studies, ordering and review of radiographic studies, pulse oximetry and re-evaluation of patient's condition.  Medications Ordered in ED Medications  enoxaparin (LOVENOX) injection 40 mg (not administered)  0.9 %  sodium chloride infusion ( Intravenous New Bag/Given 11/06/17  0710)  acetaminophen (TYLENOL) tablet 650 mg (not administered)    Or  acetaminophen (TYLENOL) suppository 650 mg (not administered)  senna-docusate (Senokot-S) tablet 1 tablet (not administered)  ondansetron (ZOFRAN) tablet 4 mg (not administered)    Or  ondansetron (ZOFRAN) injection 4 mg (not administered)  guaiFENesin (MUCINEX) 12 hr tablet 600 mg (not administered)  methylPREDNISolone sodium succinate (SOLU-MEDROL) 125 mg/2 mL injection 60 mg (not administered)  levofloxacin (LEVAQUIN) IVPB 500 mg (not administered)  ipratropium-albuterol (DUONEB) 0.5-2.5 (3) MG/3ML nebulizer solution 3 mL (not administered)  LORazepam (ATIVAN) injection 0.5 mg (not administered)  albuterol (PROVENTIL) (2.5 MG/3ML) 0.083% nebulizer solution 5 mg (5 mg Nebulization Given 11/06/17 0513)  albuterol (PROVENTIL,VENTOLIN) solution continuous neb (15 mg/hr Nebulization Given 11/06/17 0555)  ipratropium (ATROVENT) nebulizer solution 0.5 mg (0.5 mg Nebulization Given 11/06/17 0556)  sodium chloride 0.9 % bolus 1,000 mL (0 mLs Intravenous Stopped 11/06/17 0744)    And  sodium chloride 0.9 % bolus 500 mL (0 mLs Intravenous Stopped 11/06/17 0706)    And  sodium chloride 0.9 % bolus 250 mL (0 mLs Intravenous Stopped 11/06/17 0706)     Initial Impression / Assessment and Plan / ED Course  I have reviewed the triage vital signs and the nursing notes.  Pertinent labs & imaging results that were available during my care of the patient were reviewed by me and considered in my medical decision making (see chart for details).  COPD exacerbation which has clearly failed maximum outpatient management including 2 ED visits.  Old records were reviewed confirming [facility], and this ED yesterday with discharge at 11:45 PM.  Chest x-ray yesterday showed possible pneumonia, and he had been given a dose of levofloxacin.  He has already received methylprednisolone and magnesium in the ambulance.  Patient  states he has never been intubated, and he does not appear to be tiring get, but he may need support with BiPAP if he does start tiring.  He is started on a continuous nebulizer treatment.  Case is discussed with Dr. Toniann FailKakrakandy of [facility] hospitalists, who agrees to admit the patient to stepdown unit.  He does not need another dose of antibiotics, since his levofloxacin dose was within the past 24 hours.  Started to tire, and was started on BiPAP.  ABG is ordered.  Final Clinical Impressions(s) / ED Diagnoses   Final diagnoses:  COPD exacerbation (HCC)  Elevated lactic acid level    ED Discharge Orders  None       Dione Booze, MD 11/06/17 [phone]

## 2017-11-06 NOTE — Consult Note (Signed)
PULMONARY / CRITICAL CARE MEDICINE   Name: Devin Lopez MRN: [id number] DOB: 06-11-1971    ADMISSION DATE:  11/06/2017 CONSULTATION DATE: 11/06/2017  REFERRING MD: [facility]  CHIEF COMPLAINT: Shortness of breath  HISTORY OF PRESENT ILLNESS:   47 year old lifelong smoking history now currently smoking 2 cigarettes a day with known severe bullous emphysema by recent CT scan.  He presents on 11/06/2017 with 3 days of increasing shortness of breath dry cough.  Note he is a poor historian due to his increased work of breathing and decreased level of consciousness.  He is currently on noninvasive mechanical ventilatory support with respiratory rate greater than 40 and barely able to speak.  He is proven refractory to current interventions of steroids antibiotics bronchodilators and magnesium sulfate.  He will most likely need intubation and mechanical ventilatory support for severe air trapping and acute exacerbation of chronic obstructive pulmonary disease.  PAST MEDICAL HISTORY :  He  has a past medical history of COPD (chronic obstructive pulmonary disease) (HCC) and Pneumonia.  PAST SURGICAL HISTORY: He  has a past surgical history that includes Finger surgery.  No Known Allergies  No current facility-administered medications on file prior to encounter.    Current Outpatient Medications on File Prior to Encounter  Medication Sig  . albuterol (PROVENTIL HFA;VENTOLIN HFA) 108 (90 Base) MCG/ACT inhaler Inhale 1-2 puffs into the lungs every 6 (six) hours as needed for wheezing or shortness of breath.  Marland Kitchen albuterol (PROVENTIL) (2.5 MG/3ML) 0.083% nebulizer solution Take 3 mLs (2.5 mg total) by nebulization every 6 (six) hours as needed for wheezing or shortness of breath.  . guaiFENesin (MUCINEX) 600 MG 12 hr tablet Take 600 mg by mouth 2 (two) times daily as needed for cough.  Marland Kitchen ibuprofen (ADVIL,MOTRIN) 600 MG tablet Take 1 tablet (600 mg total) by mouth every 6 (six) hours as needed. (Patient  taking differently: Take 600 mg by mouth every 6 (six) hours as needed for moderate pain. )  . SPIRIVA HANDIHALER 18 MCG inhalation capsule Place 18 mcg into inhaler and inhale daily.  . SYMBICORT 160-4.5 MCG/ACT inhaler Inhale 2 puffs into the lungs 2 (two) times daily.  Marland Kitchen levofloxacin (LEVAQUIN) 500 MG tablet Take 1 tablet (500 mg total) by mouth daily.  . nicotine (NICODERM CQ - DOSED IN MG/24 HOURS) 21 mg/24hr patch Place 1 patch (21 mg total) onto the skin daily. (Patient not taking: Reported on 11/05/2017)    FAMILY HISTORY:  His indicated that his mother is deceased. He indicated that the status of his maternal grandmother is unknown.   SOCIAL HISTORY: He  reports that he has been smoking cigarettes.  He has been smoking about 0.00 packs per day. he has never used smokeless tobacco. He reports that he drinks alcohol. He reports that he uses drugs. Drugs: Cocaine and Marijuana.  REVIEW OF SYSTEMS:   Not available  SUBJECTIVE:  47 year old male in obvious respiratory distress seems to be failing on noninvasive mechanical ventilatory support.  VITAL SIGNS: BP (!) 148/90   Pulse (!) 124   Temp 97.9 F (36.6 C) (Oral)   Resp (!) 29   Ht 5\' 5"  (1.651 m)   Wt 54.4 kg (120 lb)   SpO2 100%   BMI 19.97 kg/m   HEMODYNAMICS:    VENTILATOR SETTINGS: FiO2 (%):  [40 %-98 %] 40 %  INTAKE / OUTPUT: No intake/output data recorded.  PHYSICAL EXAMINATION: General:  Frail cachectic male in acute respiratory distress Neuro: Decreased level of consciousness follows simple  commands speech is difficult to understand HEENT: No JVD is appreciated no lymphadenopathy currently on full noninvasive mechanical ventilatory support Cardiovascular: Heart sounds are regular sinus tachycardia Lungs: Expiratory wheezes are noted bilaterally, increased work of breathing with abdominal chest wall paradoxus is noted Abdomen: Positive bowel sounds Musculoskeletal: Intact Skin:  Dry  LABS:  BMET Recent Labs  Lab 11/05/17 1614 11/06/17 0509  NA 140 136  K 4.4 4.2  CL 105 103  CO2 28 23  BUN 15 15  CREATININE 1.20 1.02  GLUCOSE 115* 129*    Electrolytes Recent Labs  Lab 11/05/17 1614 11/06/17 0509  CALCIUM 9.3 8.8*    CBC Recent Labs  Lab 11/05/17 1614 11/06/17 0509  WBC 15.1* 13.3*  HGB 13.1 12.3*  HCT 41.5 38.2*  PLT 280 240    Coag's No results for input(s): APTT, INR in the last 168 hours.  Sepsis Markers Recent Labs  Lab 11/05/17 2015 11/06/17 0543 11/06/17 0714  LATICACIDVEN 2.15* 2.62* 1.37    ABG No results for input(s): PHART, PCO2ART, PO2ART in the last 168 hours.  Liver Enzymes No results for input(s): AST, ALT, ALKPHOS, BILITOT, ALBUMIN in the last 168 hours.  Cardiac Enzymes No results for input(s): TROPONINI, PROBNP in the last 168 hours.  Glucose No results for input(s): GLUCAP in the last 168 hours.  Imaging Dg Chest 2 View  Result Date: 11/05/2017 CLINICAL DATA:  Shortness of breath and wheezing since yesterday. Discharged from hospital yesterday. History of COPD and pneumonia. EXAM: CHEST  2 VIEW COMPARISON:  CT chest June 06, 2017 and chest radiograph April 13, 2017 FINDINGS: Increased lung volumes with fibrotic changes, architectural distortion and patchy consolidation, hilar retraction. No pleural effusion. No pneumothorax. Cardiomediastinal silhouette is normal. Soft tissue planes included osseous structures are nonsuspicious. IMPRESSION: Similar to worsening chronic interstitial changes and patchy consolidation seen with sarcoidosis though a component of pneumonia not excluded. Stable hyperinflation. Electronically Signed   By: Awilda Metroourtnay  Bloomer M.D.   On: 11/05/2017 17:56   Dg Chest Port 1 View  Result Date: 11/06/2017 CLINICAL DATA:  47 year old male with shortness of breath. History of COPD. EXAM: PORTABLE CHEST 1 VIEW COMPARISON:  Chest radiograph dated 11/05/2017 FINDINGS: There is emphysematous  changes of the lungs with interstitial coarsening and areas of scarring. No focal consolidation, pleural effusion, or pneumothorax. The cardiac silhouette is within normal limits no acute osseous pathology. IMPRESSION: Emphysema with chronic interstitial coarsening and scarring. No focal consolidation. Electronically Signed   By: Elgie CollardArash  Radparvar M.D.   On: 11/06/2017 06:04     STUDIES:    CULTURES: 11/06/2017 blood cultures x2>> 11/06/2017 urine culture>>  11/06/2017 sputum culture if intubated>>  ANTIBIOTICS: 110 Levaquin>>  SIGNIFICANT EVENTS: [facility]06/2017 [facility]  LINES/TUBES:   Discussion: 47 year old lifelong smoking history now currently smoking 2 cigarettes a day with known severe bullous emphysema by recent CT scan.  He presents on 11/06/2017 with 3 days of increasing shortness of breath dry cough.  Note he is a poor historian due to his increased work of breathing and decreased level of consciousness.  He is currently on noninvasive mechanical ventilatory support with respiratory rate greater than 40 and barely able to speak.  He is proven refractory to current interventions of steroids antibiotics bronchodilators and magnesium sulfate.  He will most likely need intubation and mechanical ventilatory support for severe air trapping and acute exacerbation of chronic obstructive pulmonary disease.   ASSESSMENT / PLAN:  PULMONARY A: Acute on chronic respiratory failure  with acute exacerbation of COPD with ongoing tobacco use. Currently in a acute distress is proven refractory to current interventions P:   Noninvasive mechanical ventilatory support Oxygen as needed Steroids as ordered Bronchodilators Will give 1 dose of magnesium sulfate Suspect to require intubation as he is obviously wearing out at the time of examination.   CARDIOVASCULAR A:  Hypertension P:  Consider low-dose Lasix  RENAL Lab Results  Component Value Date   CREATININE 1.02  11/06/2017   CREATININE 1.20 11/05/2017   CREATININE 1.15 04/13/2017   Recent Labs  Lab 11/05/17 1614 11/06/17 0509  K 4.4 4.2    A:   No acute issues P:   Follow labs  GASTROINTESTINAL A:   GI protection P:   PPI  HEMATOLOGIC A:   DVT prophylaxis P:  Currently on Lovenox  INFECTIOUS A:   Bronchitis P:   Levaquin per primary Intubated check sputum culture  ENDOCRINE A:   No acute issues P:   Follow glucose on labs  NEUROLOGIC A:   Awake and follows commands but somewhat stunted P:   RASS goal: 0 Careful with sedating   FAMILY  - Updates: No family at the bedside.  Patient updated and explained he may need endotracheal tube if his respiratory status is not improved.  - Inter-disciplinary family meet or Palliative Care meeting due by:  day 7  [facility] ACNP Adolph Pollack PCCM Pager [phone] till 1 pm If no answer page [phone] 11/06/2017, 9:22 AM  Attending Note:  47 year old male with COPD and polysubstance abuse that is a smoker who was recently diagnosed with COPD presentign to the hospital with SOB x3 days.  Increase in sputum production and increased WOB with no fever/chills/N/V but DOE.  No orthopnea or PND.  On exam, diminished BS in all lung fields.  I reviewed CXR myself, severe emphysema and interstitial prominence noted.  Labs with acute respiratory acidosis but I would suspect that reviewed previous BMETs is that patient is a chronic retainer and is now having some metabolic acidosis due to increased WOB.  CO2 is 60 thus dropping pH by 0.16 exactly correlating with the gas.  I suspect patient usually lives at a higher CO2 than 40 but is simply unable to compensate.  Given that history, I would diagnose this as a COPD exacerbation with acute on chronic respiratory failure requiring BiPAP.  Continue BiPAP for now.  Steroids as ordered.  Levaquin for airway sterilization.  Add PRN albuterol.  Patient is hypertensive and tachycardic, could be due  to continuous nebs or SOB, however patient does have history of cocaine abuse, last time used on 1/7 so use beta blockers with caution.  Ok to admit to the SDU and if deteriorates and requires intubation then PCCM will take over care.  The patient is critically ill with multiple organ systems failure and requires high complexity decision making for assessment and support, frequent evaluation and titration of therapies, application of advanced monitoring technologies and extensive interpretation of multiple databases.   Critical Care Time devoted to patient care services described in this note is  35  Minutes. This time reflects time of care of this signee Dr Koren Bound. This critical care time does not reflect procedure time, or teaching time or supervisory time of PA/NP/Med student/Med Resident etc but could involve care discussion time.  Alyson Reedy, M.D. [facility] Pulmonary/Critical Care Medicine. Pager: [phone]. After hours pager: [phone].

## 2017-11-06 NOTE — ED Notes (Signed)
Respiratory at bedside.

## 2017-11-06 NOTE — ED Notes (Signed)
This RN spoke with pulmonology and Toya SmothersKaren Black, pulmonology to let critical care know about pt.

## 2017-11-07 ENCOUNTER — Encounter (HOSPITAL_COMMUNITY): Payer: Self-pay | Admitting: *Deleted

## 2017-11-07 ENCOUNTER — Other Ambulatory Visit: Payer: Self-pay

## 2017-11-07 DIAGNOSIS — Z720 Tobacco use: Secondary | ICD-10-CM

## 2017-11-07 DIAGNOSIS — F1410 Cocaine abuse, uncomplicated: Secondary | ICD-10-CM

## 2017-11-07 DIAGNOSIS — J9602 Acute respiratory failure with hypercapnia: Secondary | ICD-10-CM

## 2017-11-07 DIAGNOSIS — R0603 Acute respiratory distress: Secondary | ICD-10-CM

## 2017-11-07 LAB — BASIC METABOLIC PANEL
Anion gap: 9 (ref 5–15)
BUN: 21 mg/dL — ABNORMAL HIGH (ref 6–20)
CO2: 29 mmol/L (ref 22–32)
CREATININE: 1.21 mg/dL (ref 0.61–1.24)
Calcium: 9.1 mg/dL (ref 8.9–10.3)
Chloride: 100 mmol/L — ABNORMAL LOW (ref 101–111)
GFR calc non Af Amer: >60 mL/min (ref >60)
Glucose, Bld: 111 mg/dL — ABNORMAL HIGH (ref 65–99)
Potassium: 4.3 mmol/L (ref 3.5–5.1)
SODIUM: 138 mmol/L (ref 135–145)

## 2017-11-07 LAB — CBC
HCT: 40.8 % (ref 39.0–52.0)
Hemoglobin: 12.9 g/dL — ABNORMAL LOW (ref 13.0–17.0)
MCH: 21.8 pg — ABNORMAL LOW (ref 26.0–34.0)
MCHC: 31.6 g/dL (ref 30.0–36.0)
MCV: 69 fL — ABNORMAL LOW (ref 78.0–100.0)
Platelets: 258 10*3/uL (ref 150–400)
RBC: 5.91 MIL/uL — ABNORMAL HIGH (ref 4.22–5.81)
RDW: 20.1 % — AB (ref 11.5–15.5)
WBC: 17.4 10*3/uL — ABNORMAL HIGH (ref 4.0–10.5)

## 2017-11-07 LAB — PHOSPHORUS: Phosphorus: 3.3 mg/dL (ref 2.5–4.6)

## 2017-11-07 LAB — STREP PNEUMONIAE URINARY ANTIGEN: STREP PNEUMO URINARY ANTIGEN: NEGATIVE

## 2017-11-07 LAB — MAGNESIUM: Magnesium: 2 mg/dL (ref 1.7–2.4)

## 2017-11-07 LAB — INFLUENZA PANEL BY PCR (TYPE A & B)
Influenza A By PCR: NEGATIVE
Influenza B By PCR: NEGATIVE

## 2017-11-07 LAB — PROCALCITONIN

## 2017-11-07 LAB — MRSA PCR SCREENING: MRSA by PCR: NEGATIVE

## 2017-11-07 LAB — LACTIC ACID, PLASMA: LACTIC ACID, VENOUS: 1.3 mmol/L (ref 0.5–1.9)

## 2017-11-07 MED ORDER — ASPIRIN 81 MG PO CHEW: 324.0000 mg | CHEWABLE_TABLET | ORAL | Status: DC

## 2017-11-07 MED ORDER — LEVOFLOXACIN 500 MG PO TABS
500.0000 mg | ORAL_TABLET | Freq: Every day | ORAL | Status: DC
  Administered 2017-11-07 – 2017-11-09 (×3): 500 mg via ORAL
  Filled 2017-11-07 (×3): qty 1

## 2017-11-07 MED ORDER — FAMOTIDINE 20 MG PO TABS
20.0000 mg | ORAL_TABLET | Freq: Two times a day (BID) | ORAL | Status: DC
Start: 2017-11-07 — End: 2017-11-10
  Administered 2017-11-07 – 2017-11-10 (×6): 20 mg via ORAL
  Filled 2017-11-07 (×6): qty 1

## 2017-11-07 MED ORDER — CHLORHEXIDINE GLUCONATE 0.12 % MT SOLN
15.0000 mL | Freq: Two times a day (BID) | OROMUCOSAL | Status: DC
  Administered 2017-11-07: 15 mL via OROMUCOSAL

## 2017-11-07 MED ORDER — METHYLPREDNISOLONE SODIUM SUCC 40 MG IJ SOLR
40.0000 mg | Freq: Three times a day (TID) | INTRAMUSCULAR | Status: DC
  Administered 2017-11-07 – 2017-11-10 (×10): 40 mg via INTRAVENOUS
  Filled 2017-11-07 (×11): qty 1

## 2017-11-07 MED ORDER — PIPERACILLIN-TAZOBACTAM 3.375 G IVPB: 3.3750 g | Freq: Three times a day (TID) | INTRAVENOUS | Status: DC

## 2017-11-07 MED ORDER — ARFORMOTEROL TARTRATE 15 MCG/2ML IN NEBU
15.0000 ug | INHALATION_SOLUTION | Freq: Two times a day (BID) | RESPIRATORY_TRACT | Status: DC
  Filled 2017-11-07 (×2): qty 2

## 2017-11-07 MED ORDER — FAMOTIDINE IN NACL 20-0.9 MG/50ML-% IV SOLN
20.0000 mg | Freq: Two times a day (BID) | INTRAVENOUS | Status: DC
  Administered 2017-11-07: 20 mg via INTRAVENOUS
  Filled 2017-11-07: qty 50

## 2017-11-07 MED ORDER — VANCOMYCIN HCL 500 MG IV SOLR: 500.0000 mg | Freq: Two times a day (BID) | INTRAVENOUS | Status: DC

## 2017-11-07 MED ORDER — ORAL CARE MOUTH RINSE: 15.0000 mL | Freq: Two times a day (BID) | OROMUCOSAL | Status: DC

## 2017-11-07 MED ORDER — BUDESONIDE 0.5 MG/2ML IN SUSP
0.5000 mg | Freq: Two times a day (BID) | RESPIRATORY_TRACT | Status: DC
  Administered 2017-11-07 – 2017-11-10 (×7): 0.5 mg via RESPIRATORY_TRACT
  Filled 2017-11-07 (×7): qty 2

## 2017-11-07 MED ORDER — ARFORMOTEROL TARTRATE 15 MCG/2ML IN NEBU
15.0000 ug | INHALATION_SOLUTION | Freq: Two times a day (BID) | RESPIRATORY_TRACT | Status: DC
  Administered 2017-11-07 – 2017-11-10 (×7): 15 ug via RESPIRATORY_TRACT
  Filled 2017-11-07 (×7): qty 2

## 2017-11-07 MED ORDER — ASPIRIN 300 MG RE SUPP: 300.0000 mg | RECTAL | Status: DC

## 2017-11-07 MED ORDER — HEPARIN SODIUM (PORCINE) 5000 UNIT/ML IJ SOLN
5000.0000 [IU] | Freq: Three times a day (TID) | INTRAMUSCULAR | Status: DC
  Administered 2017-11-07 – 2017-11-09 (×7): 5000 [IU] via SUBCUTANEOUS
  Filled 2017-11-07 (×8): qty 1

## 2017-11-07 MED ORDER — BUDESONIDE 0.5 MG/2ML IN SUSP
0.5000 mg | Freq: Two times a day (BID) | RESPIRATORY_TRACT | Status: DC
  Filled 2017-11-07 (×2): qty 2

## 2017-11-07 MED ORDER — VANCOMYCIN HCL IN DEXTROSE 1-5 GM/200ML-% IV SOLN
1000.0000 mg | Freq: Once | INTRAVENOUS | Status: DC
  Administered 2017-11-07: 1000 mg via INTRAVENOUS
  Filled 2017-11-07: qty 200

## 2017-11-07 MED ORDER — PNEUMOCOCCAL VAC POLYVALENT 25 MCG/0.5ML IJ INJ
0.5000 mL | INJECTION | INTRAMUSCULAR | Status: AC
  Administered 2017-11-08: 0.5 mL via INTRAMUSCULAR
  Filled 2017-11-07: qty 0.5

## 2017-11-07 MED ORDER — SODIUM CHLORIDE 0.9 % IV SOLN: 250.0000 mL | INTRAVENOUS | Status: DC | PRN

## 2017-11-07 MED ORDER — PIPERACILLIN-TAZOBACTAM 3.375 G IVPB 30 MIN
3.3750 g | Freq: Once | INTRAVENOUS | Status: AC
  Administered 2017-11-07: 3.375 g via INTRAVENOUS
  Filled 2017-11-07: qty 50

## 2017-11-07 MED ORDER — SODIUM CHLORIDE 0.9 % IV SOLN
INTRAVENOUS | Status: DC
  Administered 2017-11-07: 06:00:00 via INTRAVENOUS

## 2017-11-07 MED ORDER — GUAIFENESIN-DM 100-10 MG/5ML PO SYRP
5.0000 mL | ORAL_SOLUTION | ORAL | Status: DC | PRN
  Administered 2017-11-07 – 2017-11-10 (×16): 5 mL via ORAL
  Filled 2017-11-07 (×16): qty 5

## 2017-11-07 NOTE — ED Notes (Signed)
Attempted report X1

## 2017-11-07 NOTE — Progress Notes (Signed)
PULMONARY / CRITICAL CARE MEDICINE   Name: Devin Lopez MRN: [id number] DOB: [birth year]    ADMISSION DATE:  11/06/2017 CONSULTATION DATE:  1/10  REFERRING MD:  Willette Pa  CHIEF COMPLAINT: dyspnea  HISTORY OF PRESENT ILLNESS:   47 year old lifelong smoking history now currently smoking 2 cigarettes a day, polysubstance abuse with known severe bullous emphysema by recent CT scan.  He presents on 11/06/2017 with 3 days of increasing shortness of breath dry cough.  Note he is a poor historian due to his increased work of breathing and decreased level of consciousness.  He is currently on noninvasive mechanical ventilatory support with respiratory rate greater than 40 and barely able to speak.  He is proven refractory to current interventions of steroids antibiotics bronchodilators and magnesium sulfate.  Did not want intubation, understood that he may require it later if continued to be refractory.  Was able to titrate down on NIV and came off to room air on morning of 11/07/17.     PAST MEDICAL HISTORY :  He  has a past medical history of COPD (chronic obstructive pulmonary disease) (HCC) and Pneumonia.  PAST SURGICAL HISTORY: He  has a past surgical history that includes Finger surgery.  No Known Allergies  No current facility-administered medications on file prior to encounter.    Current Outpatient Medications on File Prior to Encounter  Medication Sig  . albuterol (PROVENTIL HFA;VENTOLIN HFA) 108 (90 Base) MCG/ACT inhaler Inhale 1-2 puffs into the lungs every 6 (six) hours as needed for wheezing or shortness of breath.  Marland Kitchen albuterol (PROVENTIL) (2.5 MG/3ML) 0.083% nebulizer solution Take 3 mLs (2.5 mg total) by nebulization every 6 (six) hours as needed for wheezing or shortness of breath.  . guaiFENesin (MUCINEX) 600 MG 12 hr tablet Take 600 mg by mouth 2 (two) times daily as needed for cough.  Marland Kitchen ibuprofen (ADVIL,MOTRIN) 600 MG tablet Take 1 tablet (600 mg total) by mouth every 6 (six)  hours as needed. (Patient taking differently: Take 600 mg by mouth every 6 (six) hours as needed for moderate pain. )  . SPIRIVA HANDIHALER 18 MCG inhalation capsule Place 18 mcg into inhaler and inhale daily.  . SYMBICORT 160-4.5 MCG/ACT inhaler Inhale 2 puffs into the lungs 2 (two) times daily.  Marland Kitchen levofloxacin (LEVAQUIN) 500 MG tablet Take 1 tablet (500 mg total) by mouth daily.  . nicotine (NICODERM CQ - DOSED IN MG/24 HOURS) 21 mg/24hr patch Place 1 patch (21 mg total) onto the skin daily. (Patient not taking: Reported on 11/05/2017)    FAMILY HISTORY:  His indicated that his mother is deceased. He indicated that the status of his maternal grandmother is unknown.   SOCIAL HISTORY: He  reports that he has been smoking cigarettes.  He has been smoking about 0.00 packs per day. he has never used smokeless tobacco. He reports that he drinks alcohol. He reports that he uses drugs. Drugs: Cocaine and Marijuana.  REVIEW OF SYSTEMS:   Not feeling SOB but knows he's breathing fast, feels like the machine wanting him to breathe more than he does, no cp, n/v/d,   SUBJECTIVE:  Feeling okay this am, on bipap, last blood gas not too bad, 100% O2 sats was able to wean from bipap to room air saturating in low 90's  VITAL SIGNS: BP 115/86   Pulse (!) 115   Temp 97.6 F (36.4 C) (Oral)   Resp (!) 27   Ht 5\' 5"  (1.651 m)   Wt 114 lb 13.8 oz (52.1  kg)   SpO2 93%   BMI 19.11 kg/m   HEMODYNAMICS:    VENTILATOR SETTINGS: FiO2 (%):  [40 %] 40 %  INTAKE / OUTPUT: I/O last 3 completed shifts: In: 3613.3 [I.V.:1763.3; IV Piggyback:1850] Out: 3060 [Urine:3060]  PHYSICAL EXAMINATION: Physical Exam  Constitutional: He is oriented to person, place, and time. He appears well-developed and well-nourished.  Eyes: Right eye exhibits no discharge. Left eye exhibits no discharge. No scleral icterus.  Neck: No JVD present.  Cardiovascular: Regular rhythm, normal heart sounds and intact distal pulses.  Tachycardia present. Exam reveals no gallop and no friction rub.  No murmur heard. Pulmonary/Chest: Tachypnea noted. He has no wheezes. He has rhonchi (diffuse). He has no rales.  Abdominal: Soft. Bowel sounds are normal. He exhibits no distension and no mass. There is no tenderness. There is no guarding.  Neurological: He is alert and oriented to person, place, and time.    LABS:  BMET Recent Labs  Lab 11/05/17 1614 11/06/17 0509 11/07/17 0256  NA 140 136 138  K 4.4 4.2 4.3  CL 105 103 100*  CO2 28 23 29   BUN 15 15 21*  CREATININE 1.20 1.02 1.21  GLUCOSE 115* 129* 111*    Electrolytes Recent Labs  Lab 11/05/17 1614 11/06/17 0509 11/07/17 0256  CALCIUM 9.3 8.8* 9.1  MG  --   --  2.0  PHOS  --   --  3.3    CBC Recent Labs  Lab 11/05/17 1614 11/06/17 0509 11/07/17 0256  WBC 15.1* 13.3* 17.4*  HGB 13.1 12.3* 12.9*  HCT 41.5 38.2* 40.8  PLT 280 240 258    Coag's No results for input(s): APTT, INR in the last 168 hours.  Sepsis Markers Recent Labs  Lab 11/06/17 0543 11/06/17 0714 11/07/17 0000 11/07/17 0003 11/07/17 0256  LATICACIDVEN 2.62* 1.37  --  1.3  --   PROCALCITON  --   --  <0.10  --  <0.10    ABG Recent Labs  Lab 11/06/17 0943 11/06/17 2038  PHART 7.232* 7.344*  PCO2ART 59.2* 53.7*  PO2ART 176.0* 188.0*    Liver Enzymes No results for input(s): AST, ALT, ALKPHOS, BILITOT, ALBUMIN in the last 168 hours.  Cardiac Enzymes No results for input(s): TROPONINI, PROBNP in the last 168 hours.  Glucose No results for input(s): GLUCAP in the last 168 hours.  Imaging Dg Chest Port 1 View  Result Date: 11/06/2017 CLINICAL DATA:  Shortness of breath and dry cough with wheezing. EXAM: PORTABLE CHEST 1 VIEW COMPARISON:  11/06/2017 and 11/05/2017 as well as 04/13/2017, 03/04/2017 FINDINGS: Lungs are adequately inflated demonstrate heterogeneous opacification over the mid to upper lungs without significant change. No definite focal acute airspace  process. No effusions. Cardiomediastinal silhouette and remainder of the exam is unchanged. IMPRESSION: Chronic heterogeneous opacification over the mid to upper lungs. No definite acute findings. Electronically Signed   By: Elberta Fortis M.D.   On: 11/06/2017 20:26     STUDIES:  Flu panel neg   CULTURES: 11/06/2017 blood cultures x2>> 11/06/2017 urine culture>>  11/06/2017 sputum culture if intubated>>  ANTIBIOTICS: 1/10 Levaquin>>   SIGNIFICANT EVENTS: 11/06/2017 admitted to [facility]   LINES/TUBES:     Discussion: 47 year old lifelong smoking history now currently smoking 2 cigarettes a day, polysubstance abuse with known severe bullous emphysema by recent CT scan.  He presents on 11/06/2017 with 3 days of increasing shortness of breath dry cough.  Note he is a poor historian due to his increased work of  breathing and decreased level of consciousness.  He is currently on noninvasive mechanical ventilatory support with respiratory rate greater than 40 and barely able to speak.  He is proven refractory to current interventions of steroids antibiotics bronchodilators and magnesium sulfate.  He will most likely need intubation and mechanical ventilatory support for severe air trapping and acute exacerbation of chronic obstructive pulmonary disease.     ASSESSMENT / PLAN:   PULMONARY A: Acute on chronic hypercapneic respiratory failure with acute exacerbation of COPD with ongoing tobacco use.  P:   Noninvasive mechanical ventilatory support Oxygen as needed Steroids as ordered Bronchodilators Given 1 dose of magnesium sulfate Last blood gas with good interval improvement Taken off bipap this am due to improvement Oxygenating in low 90's on RA Walk test given, becomes tachypneic slightly desaturates as well     CARDIOVASCULAR A:  Hypertension No known CAD or CHF BNP neg P:  Not hypertensive this am Can add on as necessary   RENAL No acute  issues  GASTROINTESTINAL No acute issues Start regular diet  HEMATOLOGIC No acute issues   INFECTIOUS Not ill recently +/- on change in new sputum, neg procalcitonin, chest x-ray not suspicious for infection Influenza panel neg  Will d/c zosyn keep levaquin therapy    ENDOCRINE No acute issues  NEUROLOGIC Awake, alert following commands   Disposition: will transfer to stepdown    Pulmonary and Critical Care Medicine [facility] Pager: [phone]  11/07/2017, 11:24 AM

## 2017-11-07 NOTE — Progress Notes (Signed)
Given report to 5W nurse. Will transport patient after I eat. Will continue to monitor closely. Huntley EstelleLewis, Gianah Batt E, RN 11/07/2017 1:18 PM

## 2017-11-07 NOTE — Consult Note (Signed)
PULMONARY / CRITICAL CARE MEDICINE   Name: Devin Lopez MRN: [id number] DOB: [birth year]    ADMISSION DATE:  11/06/2017 CONSULTATION DATE: 11/07/17  REFERRING MD: Dr Willette Pa  CHIEF COMPLAINT: SOB  HISTORY OF PRESENT ILLNESS:   47yoM with hx COPD, Active tobacco abuse, and (per UDS today) Cocaine abuse, who presented to the ER on 1/10 AM c/o SOB x several days. He was thought to be in a COPD exacerbation and was given nebs, steroids, and started on BIPAP for acute hypercapnea and work of breathing. ICU was consulted and at that time he was thought tenuous but okay for SDU with close monitoring. Since then his work of breathing has worsened, and ICU is again asked to reconsult. At time of my exam patient with RR 30-35 on BIPAP. He reports cough productive of sputum and wheezing "a few days ago" which has since resolved, but now with increasing SOB. Reports subjective F/C at home but did not take his temperature. Denies sick contacts. Denies CP. Patient says his SOB feels improved while in the BIPAP but that he is still working hard. He says he doesn't want intubation now as he feels he doesn't need it; but he says if he worsens he is agreeable to intubation if needed.   Of note, prior to this admission, he was seen in the [facility] ER 3 days ago with the same symptoms and was discharged. He then first presented to [facility] ER on 1/9 with same symptoms, given steroids and nebulizer after which patient felt better and was discharged. He now returned to [facility] ER on 1/10 with worsening of same symptoms.   PAST MEDICAL HISTORY :  He  has a past medical history of COPD (chronic obstructive pulmonary disease) (HCC) and Pneumonia.  PAST SURGICAL HISTORY: He  has a past surgical history that includes Finger surgery.  No Known Allergies  No current facility-administered medications on file prior to encounter.    Current Outpatient Medications on File Prior to Encounter  Medication Sig  . albuterol (PROVENTIL  HFA;VENTOLIN HFA) 108 (90 Base) MCG/ACT inhaler Inhale 1-2 puffs into the lungs every 6 (six) hours as needed for wheezing or shortness of breath.  Marland Kitchen albuterol (PROVENTIL) (2.5 MG/3ML) 0.083% nebulizer solution Take 3 mLs (2.5 mg total) by nebulization every 6 (six) hours as needed for wheezing or shortness of breath.  . guaiFENesin (MUCINEX) 600 MG 12 hr tablet Take 600 mg by mouth 2 (two) times daily as needed for cough.  Marland Kitchen ibuprofen (ADVIL,MOTRIN) 600 MG tablet Take 1 tablet (600 mg total) by mouth every 6 (six) hours as needed. (Patient taking differently: Take 600 mg by mouth every 6 (six) hours as needed for moderate pain. )  . SPIRIVA HANDIHALER 18 MCG inhalation capsule Place 18 mcg into inhaler and inhale daily.  . SYMBICORT 160-4.5 MCG/ACT inhaler Inhale 2 puffs into the lungs 2 (two) times daily.  Marland Kitchen levofloxacin (LEVAQUIN) 500 MG tablet Take 1 tablet (500 mg total) by mouth daily.  . nicotine (NICODERM CQ - DOSED IN MG/24 HOURS) 21 mg/24hr patch Place 1 patch (21 mg total) onto the skin daily. (Patient not taking: Reported on 11/05/2017)   FAMILY HISTORY:  His indicated that his mother is deceased. He indicated that the status of his maternal grandmother is unknown.  SOCIAL HISTORY: He  reports that he has been smoking cigarettes.  He has been smoking about 0.00 packs per day. he has never used smokeless tobacco. He reports that he drinks alcohol. He reports  that he uses drugs. Drugs: Cocaine and Marijuana.  REVIEW OF SYSTEMS:   Review of Systems  Constitutional: Positive for chills and fever.  HENT: Negative.   Eyes: Negative.   Respiratory: Positive for cough, sputum production, shortness of breath and wheezing.   Cardiovascular: Negative.   Gastrointestinal: Negative.   Genitourinary: Negative.   Musculoskeletal: Negative.   Skin: Negative.   Neurological: Negative.   Endo/Heme/Allergies: Negative.   Psychiatric/Behavioral: Negative.    SUBJECTIVE:  Patient reports SOB  which is improved on BIPAP. Notes had productive cough and wheezing a few days ago now resolved. Reports subjective F/C. Complains of being hungry and of not getting any sleep in 3 days.   VITAL SIGNS: BP 109/78   Pulse (!) 119   Temp (!) 97 F (36.1 C) (Axillary)   Resp (!) 28   Ht 5\' 5"  (1.651 m)   Wt 54.4 kg (120 lb)   SpO2 100%   BMI 19.97 kg/m   VENTILATOR SETTINGS: FiO2 (%):  [40 %-98 %] 40 %  INTAKE / OUTPUT: I/O last 3 completed shifts: In: 1750 [IV Piggyback:1750] Out: 1360 [Urine:1360]  PHYSICAL EXAMINATION: General: Thin adult male, lying on ER stretcher on BIPAP, in respiratory distress, critically ill Neuro: AAOx3, moving all extremities  HEENT: OP clear, MM moist, BIPAP mask in place  Cardiovascular: Tachycardic with a regular rhythm, no m/r/g Lungs: Coarse breath sounds b/l anteriorly and posteriorly, Moderate accessory muscle use while ON BIPAP, Speaking in short sentences Abdomen: Soft flat, NTND Musculoskeletal: no LE edema  Skin: no rashes   LABS:  BMET Recent Labs  Lab 11/05/17 1614 11/06/17 0509  NA 140 136  K 4.4 4.2  CL 105 103  CO2 28 23  BUN 15 15  CREATININE 1.20 1.02  GLUCOSE 115* 129*   Electrolytes Recent Labs  Lab 11/05/17 1614 11/06/17 0509  CALCIUM 9.3 8.8*   CBC Recent Labs  Lab 11/05/17 1614 11/06/17 0509  WBC 15.1* 13.3*  HGB 13.1 12.3*  HCT 41.5 38.2*  PLT 280 240   Coag's No results for input(s): APTT, INR in the last 168 hours.  Sepsis Markers Recent Labs  Lab 11/05/17 2015 11/06/17 0543 11/06/17 0714  LATICACIDVEN 2.15* 2.62* 1.37   ABG Recent Labs  Lab 11/06/17 0943 11/06/17 2038  PHART 7.232* 7.344*  PCO2ART 59.2* 53.7*  PO2ART 176.0* 188.0*   Liver Enzymes No results for input(s): AST, ALT, ALKPHOS, BILITOT, ALBUMIN in the last 168 hours.  Cardiac Enzymes No results for input(s): TROPONINI, PROBNP in the last 168 hours.  Glucose No results for input(s): GLUCAP in the last 168  hours.  Imaging Dg Chest Port 1 View  Result Date: 11/06/2017 CLINICAL DATA:  Shortness of breath and dry cough with wheezing. EXAM: PORTABLE CHEST 1 VIEW COMPARISON:  11/06/2017 and 11/05/2017 as well as 04/13/2017, 03/04/2017 FINDINGS: Lungs are adequately inflated demonstrate heterogeneous opacification over the mid to upper lungs without significant change. No definite focal acute airspace process. No effusions. Cardiomediastinal silhouette and remainder of the exam is unchanged. IMPRESSION: Chronic heterogeneous opacification over the mid to upper lungs. No definite acute findings. Electronically Signed   By: Elberta Fortisaniel  Boyle M.D.   On: 11/06/2017 20:26   Dg Chest Port 1 View  Result Date: 11/06/2017 CLINICAL DATA:  47 year old male with shortness of breath. History of COPD. EXAM: PORTABLE CHEST 1 VIEW COMPARISON:  Chest radiograph dated 11/05/2017 FINDINGS: There is emphysematous changes of the lungs with interstitial coarsening and areas of scarring. No focal  consolidation, pleural effusion, or pneumothorax. The cardiac silhouette is within normal limits no acute osseous pathology. IMPRESSION: Emphysema with chronic interstitial coarsening and scarring. No focal consolidation. Electronically Signed   By: Elgie Collard M.D.   On: 11/06/2017 06:04   CULTURES: Blood cultures: pending  ANTIBIOTICS: Levofloxacin 1/10 >>  LINES/TUBES: 2-PIV's  DISCUSSION: 47yoM with hx COPD, Active tobacco abuse, and Cocaine abuse, admitted with COPD exacerbation and questionable underlying pneumonia, requiring BIPAP for work of breathing as well as acute hypercapnic respiratory failure.   ASSESSMENT / PLAN:  PULMONARY 1. Acute Hypercapnic Respiratory failure; COPD Exacerbation; Questionable underlying ILD; Questionable Pneumonia: - initial ABG showed acute hypercapnea; since then patient has been diuresed and has developed a contraction metabolic alkylosis (HCO3 increased from 24>>29) with pH now 7.34 ON  Bipap. However patient still has significant increased work of breathing and respiratory distress and is high risk for requiring intubation. Therefore will admit him to ICU as opposed to SDU.  - CXR on my review shows BUL infiltrates that has been present previously but are slightly more prominent today. Unclear if patient has underlying ILD and if so if this could represent an ILD flare vs superimposed pneumonia - Recheck lactate; check procalcitonin; obtain sputum culture if possible - Check Flu PCR and MRSA nares PCR - Broaden antibiotics to Vanc and Zosyn - Is on Symicort, Spiriva, and Albuterol at home. Will add Pulmicort and Brovana nebs BID, continue scheduled duonebs and solumedrol 60mg  IV q6hrs - continue BIPAP; repeat VBG now and adjust settings as needed. - he has no hx of CHF and is clinically intravascularly depleted; therefore would NOT recommend diuresing anymore.   2. Tobacco abuse: - continue nicotine patch  CARDIOVASCULAR No active issues   RENAL No active issues   GASTROINTESTINAL No active issues; NPO; GI prophylaxis  HEMATOLOGIC No active issues; DVT prophylaxis  INFECTIOUS See plan above  ENDOCRINE No active issues   NEUROLOGIC 1. Cocaine abuse: will need counseling prior to discharge as this is likely playing a part in his COPD exacerbations   FAMILY  - Updated several family members at bedside.  - Inter-disciplinary family meet or Palliative Care meeting due by: 11/14/17  60 minutes critical care time  Milana Obey, MD  Pulmonary and Critical Care Medicine [facility] Pager: [phone]  11/07/2017, 12:07 AM

## 2017-11-07 NOTE — Progress Notes (Signed)
Patient was taken off BIPAP by RN. Currently on RA, vitals stable. RT will continue to monitor.

## 2017-11-07 NOTE — Progress Notes (Signed)
Got patient out of bed to stand to use urinal, HR went to 130s and respiratory rate in the 40s. Walked patient to chair to get out of bed to sit up for awhile. Currently now back on room air and will monitor closely. Huntley EstelleLewis, Lilburn Straw E, RN 11/07/2017 11:39 AM

## 2017-11-07 NOTE — Progress Notes (Signed)
Pharmacy Antibiotic Note  Devin Lopez is a 47 y.o. male admitted on 11/06/2017 with pneumonia.  Pharmacy has been consulted for vancomycin and zosyn dosing. WBC 13., LA 2.6, CXR heterogeneous opacification   Plan: Vancomycin 1000mg  x1 then 500mg  IV every 12 hours.  Goal trough 15-20 mcg/mL. Zosyn 3.375g IV q8h (4 hour infusion). Monitor clinical progression and LOT  Height: 5\' 5"  (165.1 cm) Weight: 120 lb (54.4 kg) IBW/kg (Calculated) : 61.5  Temp (24hrs), Avg:97.5 F (36.4 C), Min:97 F (36.1 C), Max:97.9 F (36.6 C)  Recent Labs  Lab 11/05/17 1614 11/05/17 2015 11/06/17 0509 11/06/17 0543 11/06/17 0714  WBC 15.1*  --  13.3*  --   --   CREATININE 1.20  --  1.02  --   --   LATICACIDVEN  --  2.15*  --  2.62* 1.37    Estimated Creatinine Clearance: 68.9 mL/min (by C-G formula based on SCr of 1.02 mg/dL).    No Known Allergies  Thank you for allowing pharmacy to be a part of this patient's care.  Toniann Failony L Loriene Taunton 11/07/2017 12:23 AM

## 2017-11-08 LAB — BASIC METABOLIC PANEL
ANION GAP: 6 (ref 5–15)
BUN: 28 mg/dL — ABNORMAL HIGH (ref 6–20)
CHLORIDE: 101 mmol/L (ref 101–111)
CO2: 29 mmol/L (ref 22–32)
Calcium: 8.9 mg/dL (ref 8.9–10.3)
Creatinine, Ser: 1.11 mg/dL (ref 0.61–1.24)
GFR calc Af Amer: >60 mL/min (ref >60)
GFR calc non Af Amer: >60 mL/min (ref >60)
GLUCOSE: 115 mg/dL — AB (ref 65–99)
Potassium: 4.5 mmol/L (ref 3.5–5.1)
Sodium: 136 mmol/L (ref 135–145)

## 2017-11-08 LAB — LEGIONELLA PNEUMOPHILA SEROGP 1 UR AG: L. pneumophila Serogp 1 Ur Ag: NEGATIVE

## 2017-11-08 LAB — MAGNESIUM: Magnesium: 2.1 mg/dL (ref 1.7–2.4)

## 2017-11-08 MED ORDER — GUAIFENESIN ER 600 MG PO TB12
1200.0000 mg | ORAL_TABLET | Freq: Two times a day (BID) | ORAL | Status: DC
  Administered 2017-11-08 – 2017-11-10 (×4): 1200 mg via ORAL
  Filled 2017-11-08 (×4): qty 2

## 2017-11-08 MED ORDER — ZOLPIDEM TARTRATE 5 MG PO TABS
5.0000 mg | ORAL_TABLET | Freq: Every evening | ORAL | Status: DC | PRN
  Administered 2017-11-08 – 2017-11-09 (×3): 5 mg via ORAL
  Filled 2017-11-08 (×3): qty 1

## 2017-11-08 NOTE — Progress Notes (Signed)
[place] pulmonary and critical care medicine  Admitted November 06, 2017 Admission diagnosis acute respiratory failure with hypoxemia, COPD exacerbation, cocaine abuse  Brief history of present illness 47 year old male who currently smokes cigarettes as well as uses inhaled cocaine on a weekly basis was admitted with a COPD exacerbation.  Of note, he had been treated and released from an emergency department at least twice in the same week prior to his presentation to our emergency department on January 10.  He required BiPAP due to respiratory distress on admission.  Subjective: Still coughing, has difficulty clearing secretions Wheezing somewhat   On exam: Vitals:   11/08/17 0915 11/08/17 0916 11/08/17 1010 11/08/17 1240  BP:   125/87   Pulse:   (!) 110   Resp:   (!) 24   Temp:      TempSrc:      SpO2: 91% 91% 96% 97%  Weight:      Height:       RA  General:  Sitting up in chair HENT: NCAT OP clear PULM: Wheezing bilaterally B, normal effort CV: RRR, no mgr GI: BS+, soft, nontender MSK: normal bulk and tone Neuro: awake, alert, no distress, MAEW  Chest x-ray images independently reviewed showing upper lobe predominant airspace disease  CT chest images reviewed showing emphysema, upper lobe predominant, also masslike areas of scarring centrally bilaterally  Impression: COPD exacerbation Tobacco abuse Cocaine abuse Chest congestion Shortness of breath Abnormal CT chest  Discussion:  I question whether or not he could have silicosis based on the findings from his CT chest.  Also I have seen cocaine cause similar findings on CT chest.  Regardless, he has an exacerbation of COPD and is still quite symptomatic and tachycardic today.  He is high risk for re-admission.    Plan: Continue IV Solu-Medrol Continue levaquin Continue bronchodilators as prescribed : Brovana, Pulmicort, DuoNeb every 4 hours Increase Mucinex Add flutter valve Continue regular diet Counseled to  quit smoking cigarettes Counseled to quit smoking cocaine  Feeding: reg diet Analgesia: n/a Sedation: n/a Thromboprophylaxis: sub q hep HOB >30 degrees Ulcer prophylaxis: famotidine Glucose control: n/a  Transfer to [facility]  Heber CarolinaBrent McQuaid, MD [place] [facility] Pager: [phone] Cell: [phone] After 3pm or if no response, call [phone]

## 2017-11-09 DIAGNOSIS — R7989 Other specified abnormal findings of blood chemistry: Secondary | ICD-10-CM

## 2017-11-09 LAB — BASIC METABOLIC PANEL
ANION GAP: 9 (ref 5–15)
BUN: 24 mg/dL — ABNORMAL HIGH (ref 6–20)
CO2: 27 mmol/L (ref 22–32)
CREATININE: 1.04 mg/dL (ref 0.61–1.24)
Calcium: 9.1 mg/dL (ref 8.9–10.3)
Chloride: 103 mmol/L (ref 101–111)
GFR calc non Af Amer: >60 mL/min (ref >60)
Glucose, Bld: 111 mg/dL — ABNORMAL HIGH (ref 65–99)
POTASSIUM: 4.6 mmol/L (ref 3.5–5.1)
Sodium: 139 mmol/L (ref 135–145)

## 2017-11-09 MED ORDER — BENZONATATE 100 MG PO CAPS
100.0000 mg | ORAL_CAPSULE | Freq: Three times a day (TID) | ORAL | Status: DC | PRN
  Administered 2017-11-09 – 2017-11-10 (×3): 100 mg via ORAL
  Filled 2017-11-09 (×3): qty 1

## 2017-11-09 NOTE — Progress Notes (Signed)
PROGRESS NOTE    Devin Lopez  IRJ:[id number] DOB: 1971/01/11 DOA: 11/06/2017 PCP: Patient, No Pcp Per  Brief Narrative: 47 year old male who smokes cigarettes and cocaine on a regular basis was admitted with acute hypoxic respiratory failure secondary to COPD exacerbation, treated with NIPPV, was under critical care service for 2 days and transferred to [facility] today 1/13  Assessment & Plan:   1. Acute hypoxic and hypercarbic respiratory failure -Secondary to COPD exacerbation and cocaine abuse -Flu PCR and blood cultures negative -CT chest showing emphysema, upper lobe predominant, also masslike areas of scarring centrally bilaterally -Per pulmonary note there is question whether he could have silicosis based on CT chest findings versus cocaine induced -Continue IV Solu-Medrol will cut down dose -Continue Levaquin, now Pulmicort and DuoNeb's -Continue flutter valve and incentive spirometry -Ambulate, out of bed, wean off oxygen -Will need pulmonary follow-up and PFTs as outpatient  2. Polysubstance abuse/cocaine abuse  -counseled  DVT prophylaxis: heparin subcutaneous  Code Status:  full code  Family Communication: no family at bedside  Disposition Plan: Home likely tomorrow if improving   Consultants:   [facility] M transfer   Procedures:   Antimicrobials:    Subjective: -Breathing improving, not back to baseline yet still has difficulty clearing secretions  Objective: Vitals:   11/09/17 0750 11/09/17 0800 11/09/17 0802 11/09/17 1116  BP:      Pulse: 87     Resp: (!) 22     Temp:      TempSrc:      SpO2: 95% 98% 99% 95%  Weight:      Height:        Intake/Output Summary (Last 24 hours) at 11/09/2017 1222 Last data filed at 11/09/2017 0913 Gross per 24 hour  Intake 1050 ml  Output 450 ml  Net 600 ml   Filed Weights   11/07/17 0209 11/08/17 0500 11/09/17 0610  Weight: 52.1 kg (114 lb 13.8 oz) 56.8 kg (125 lb 3.5 oz) 58.3 kg (128 lb 8.5 oz)     Examination:  General exam: Thinly built, frail appearing male Respiratory system: Poor air movement, scattered rhonchi, wheezes Cardiovascular system: S1 & S2 heard, RRR Gastrointestinal system: Abdomen is nondistended, soft and nontender.Normal bowel sounds heard. Central nervous system: Alert and oriented. No focal neurological deficits. Extremities: Symmetric 5 x 5 power. Skin: No rashes, lesions or ulcers Psychiatry: Judgement and insight appear normal. Mood & affect appropriate.     Data Reviewed:   CBC: Recent Labs  Lab 11/05/17 1614 11/06/17 0509 11/07/17 0256  WBC 15.1* 13.3* 17.4*  HGB 13.1 12.3* 12.9*  HCT 41.5 38.2* 40.8  MCV 69.1* 68.5* 69.0*  PLT 280 240 258   Basic Metabolic Panel: Recent Labs  Lab 11/05/17 1614 11/06/17 0509 11/07/17 0256 11/08/17 0536 11/09/17 0553  NA 140 136 138 136 139  K 4.4 4.2 4.3 4.5 4.6  CL 105 103 100* 101 103  CO2 28 23 29 29 27   GLUCOSE 115* 129* 111* 115* 111*  BUN 15 15 21* 28* 24*  CREATININE 1.20 1.02 1.21 1.11 1.04  CALCIUM 9.3 8.8* 9.1 8.9 9.1  MG  --   --  2.0 2.1  --   PHOS  --   --  3.3  --   --    GFR: Estimated Creatinine Clearance: 72.4 mL/min (by C-G formula based on SCr of 1.04 mg/dL). Liver Function Tests: No results for input(s): AST, ALT, ALKPHOS, BILITOT, PROT, ALBUMIN in the last 168 hours. No results for input(s):  LIPASE, AMYLASE in the last 168 hours. No results for input(s): AMMONIA in the last 168 hours. Coagulation Profile: No results for input(s): INR, PROTIME in the last 168 hours. Cardiac Enzymes: No results for input(s): CKTOTAL, CKMB, CKMBINDEX, TROPONINI in the last 168 hours. BNP (last 3 results) No results for input(s): PROBNP in the last 8760 hours. HbA1C: No results for input(s): HGBA1C in the last 72 hours. CBG: No results for input(s): GLUCAP in the last 168 hours. Lipid Profile: No results for input(s): CHOL, HDL, LDLCALC, TRIG, CHOLHDL, LDLDIRECT in the last 72  hours. Thyroid Function Tests: No results for input(s): TSH, T4TOTAL, FREET4, T3FREE, THYROIDAB in the last 72 hours. Anemia Panel: No results for input(s): VITAMINB12, FOLATE, FERRITIN, TIBC, IRON, RETICCTPCT in the last 72 hours. Urine analysis:    Component Value Date/Time   COLORURINE YELLOW 02/04/2017 1312   APPEARANCEUR HAZY (A) 02/04/2017 1312   LABSPEC 1.015 02/04/2017 1312   PHURINE 6.0 02/04/2017 1312   GLUCOSEU NEGATIVE 02/04/2017 1312   HGBUR MODERATE (A) 02/04/2017 1312   BILIRUBINUR small 02/06/2017 0959   KETONESUR NEGATIVE 02/04/2017 1312   PROTEINUR 30 02/06/2017 0959   PROTEINUR NEGATIVE 02/04/2017 1312   UROBILINOGEN 0.2 02/06/2017 0959   NITRITE negative 02/06/2017 0959   NITRITE NEGATIVE 02/04/2017 1312   LEUKOCYTESUR Small (1+) (A) 02/06/2017 0959   Sepsis Labs: @LABRCNTIP (procalcitonin:4,lacticidven:4)  ) Recent Results (from the past 240 hour(s))  Blood Culture (routine x 2)     Status: None (Preliminary result)   Collection Time: 11/06/17  6:05 AM  Result Value Ref Range Status   Specimen Description BLOOD ARM RIGHT UPPER  Final   Special Requests   Final    BOTTLES DRAWN AEROBIC AND ANAEROBIC Blood Culture adequate volume   Culture NO GROWTH 2 DAYS  Final   Report Status PENDING  Incomplete  Blood Culture (routine x 2)     Status: None (Preliminary result)   Collection Time: 11/06/17  6:13 AM  Result Value Ref Range Status   Specimen Description BLOOD RIGHT ARM  Final   Special Requests IN PEDIATRIC BOTTLE Blood Culture adequate volume  Final   Culture NO GROWTH 2 DAYS  Final   Report Status PENDING  Incomplete  MRSA PCR Screening     Status: None   Collection Time: 11/07/17 12:22 AM  Result Value Ref Range Status   MRSA by PCR NEGATIVE NEGATIVE Final    Comment:        The GeneXpert MRSA Assay (FDA approved for NASAL specimens only), is one component of a comprehensive MRSA colonization surveillance program. It is not intended to  diagnose MRSA infection nor to guide or monitor treatment for MRSA infections.          Radiology Studies: No results found.      Scheduled Meds: . arformoterol  15 mcg Nebulization BID  . budesonide (PULMICORT) nebulizer solution  0.5 mg Nebulization BID  . famotidine  20 mg Oral BID  . guaiFENesin  1,200 mg Oral BID  . heparin  5,000 Units Subcutaneous Q8H  . ipratropium-albuterol  3 mL Nebulization Q4H  . levofloxacin  500 mg Oral Daily  . methylPREDNISolone (SOLU-MEDROL) injection  40 mg Intravenous Q8H   Continuous Infusions: . sodium chloride       LOS: 3 days    Time spent: 35min    Zannie CovePreetha Suzanna Zahn, MD [facility] Page via [email or web address], password [email or web address] After 7PM please contact night-coverage  11/09/2017, 12:22 PM

## 2017-11-10 MED ORDER — PREDNISONE 50 MG PO TABS: 50.0000 mg | ORAL_TABLET | Freq: Every day | ORAL | Status: DC

## 2017-11-10 MED ORDER — BENZONATATE 100 MG PO CAPS: 100.0000 mg | ORAL_CAPSULE | Freq: Three times a day (TID) | ORAL | 0 refills | Status: DC | PRN

## 2017-11-10 MED ORDER — PREDNISONE 20 MG PO TABS: 20.0000 mg | ORAL_TABLET | Freq: Every day | ORAL | 0 refills | Status: DC

## 2017-11-10 MED ORDER — METHYLPREDNISOLONE SODIUM SUCC 40 MG IJ SOLR: 40.0000 mg | Freq: Three times a day (TID) | INTRAMUSCULAR | Status: DC

## 2017-11-10 MED ORDER — PREDNISONE 50 MG PO TABS: 50.0000 mg | ORAL_TABLET | Freq: Once | ORAL | Status: DC

## 2017-11-10 NOTE — Progress Notes (Signed)
SATURATION QUALIFICATIONS:  Patient Saturations on Room Air at Rest = 96% HR 105  Patient Saturations on Room Air while Ambulating = 93% HR 127

## 2017-11-10 NOTE — Discharge Summary (Addendum)
Physician Discharge Summary  Devin Lopez ZOX:[id number] DOB: 1971-08-06 DOA: 11/06/2017  PCP: Patient, No Pcp Per  Admit date: 11/06/2017 Discharge date: 11/10/2017  Time spent: 45 minutes  Recommendations for Outpatient Follow-up:  1. FU with Pulm at [facility], has appt with Rosina Lowenstein PA-C on 11/14/17, please consider FU CT chest    Discharge Diagnoses:  Principal Problem:   Acute hypoxic and hypercarbic resp failure   COPD exacerbation (HCC)   Severe COPD   Cocaine abuse   Tachycardia   Hypertension   Tobacco use   Acute respiratory failure with hypercapnia [facility])   Discharge Condition: stable  Diet recommendation: heart healthy  Filed Weights   11/08/17 0500 11/09/17 0610 11/10/17 0654  Weight: 56.8 kg (125 lb 3.5 oz) 58.3 kg (128 lb 8.5 oz) 54.3 kg (119 lb 11.4 oz)    History of present illness:  47 year old male who smokes cigarettes and cocaine on a regular basis was admitted with acute hypoxic respiratory failure secondary to COPD exacerbation  Hospital Course:  1. Acute hypoxic and hypercarbic respiratory failure -Secondary to COPD exacerbation and cocaine abuse -treated in ICU with NIPPV, steroids, abx, nebs -Flu PCR and blood cultures negative -CT chest showing emphysema, upper lobe predominant, also masslike areas of scarring centrally bilaterally -Per pulmonary note there is question whether he could have silicosis based on CT chest findings versus cocaine induced -clinically improved and resp status back to baseline per pt, weaned off O2 -transitioned to Prednisone taper -Will need close pulmonary follow-up and PFTs as outpatient, has appt with Pulm at [facility] on 18th of this month  2. Polysubstance abuse/cocaine abuse  -counseled    Discharge Exam: Vitals:   11/10/17 1116 11/10/17 1440  BP:    Pulse:  (!) 130  Resp:  (!) 26  Temp:    SpO2: 95% 93%    General: AAOx3 Cardiovascular: S1S2/RRRR Respiratory: improved air movement  Discharge  Instructions   Discharge Instructions    Diet - low sodium heart healthy   Complete by:  As directed    Increase activity slowly   Complete by:  As directed      Allergies as of 11/10/2017   No Known Allergies     Medication List    STOP taking these medications   levofloxacin 500 MG tablet Commonly known as:  LEVAQUIN     TAKE these medications   albuterol 108 (90 Base) MCG/ACT inhaler Commonly known as:  PROVENTIL HFA;VENTOLIN HFA Inhale 1-2 puffs into the lungs every 6 (six) hours as needed for wheezing or shortness of breath.   albuterol (2.5 MG/3ML) 0.083% nebulizer solution Commonly known as:  PROVENTIL Take 3 mLs (2.5 mg total) by nebulization every 6 (six) hours as needed for wheezing or shortness of breath.   benzonatate 100 MG capsule Commonly known as:  TESSALON Take 1 capsule (100 mg total) by mouth 3 (three) times daily as needed for cough.   guaiFENesin 600 MG 12 hr tablet Commonly known as:  MUCINEX Take 600 mg by mouth 2 (two) times daily as needed for cough.   ibuprofen 600 MG tablet Commonly known as:  ADVIL,MOTRIN Take 1 tablet (600 mg total) by mouth every 6 (six) hours as needed. What changed:  reasons to take this   nicotine 21 mg/24hr patch Commonly known as:  NICODERM CQ - dosed in mg/24 hours Place 1 patch (21 mg total) onto the skin daily.   predniSONE 20 MG tablet Commonly known as:  DELTASONE Take 1-2 tablets (  20-40 mg total) by mouth daily with breakfast. Take 40mg  for 2days then 20mg  for 2days then 10mg  for 2days then STOP   SPIRIVA HANDIHALER 18 MCG inhalation capsule Generic drug:  tiotropium Place 18 mcg into inhaler and inhale daily.   SYMBICORT 160-4.5 MCG/ACT inhaler Generic drug:  budesonide-formoterol Inhale 2 puffs into the lungs 2 (two) times daily.      No Known Allergies Follow-up Information    Larry Sierras, FNP Follow up.   Specialty:  Allergy Why:  keep your appt later this week Contact  information: [address] [place] [address] [phone]            The results of significant diagnostics from this hospitalization (including imaging, microbiology, ancillary and laboratory) are listed below for reference.    Significant Diagnostic Studies: Dg Chest 2 View  Result Date: 11/05/2017 CLINICAL DATA:  Shortness of breath and wheezing since yesterday. Discharged from hospital yesterday. History of COPD and pneumonia. EXAM: CHEST  2 VIEW COMPARISON:  CT chest June 06, 2017 and chest radiograph April 13, 2017 FINDINGS: Increased lung volumes with fibrotic changes, architectural distortion and patchy consolidation, hilar retraction. No pleural effusion. No pneumothorax. Cardiomediastinal silhouette is normal. Soft tissue planes included osseous structures are nonsuspicious. IMPRESSION: Similar to worsening chronic interstitial changes and patchy consolidation seen with sarcoidosis though a component of pneumonia not excluded. Stable hyperinflation. Electronically Signed   By: Awilda Metro M.D.   On: 11/05/2017 17:56   Dg Chest Port 1 View  Result Date: 11/06/2017 CLINICAL DATA:  Shortness of breath and dry cough with wheezing. EXAM: PORTABLE CHEST 1 VIEW COMPARISON:  11/06/2017 and 11/05/2017 as well as 04/13/2017, 03/04/2017 FINDINGS: Lungs are adequately inflated demonstrate heterogeneous opacification over the mid to upper lungs without significant change. No definite focal acute airspace process. No effusions. Cardiomediastinal silhouette and remainder of the exam is unchanged. IMPRESSION: Chronic heterogeneous opacification over the mid to upper lungs. No definite acute findings. Electronically Signed   By: Elberta Fortis M.D.   On: 11/06/2017 20:26   Dg Chest Port 1 View  Result Date: 11/06/2017 CLINICAL DATA:  47 year old male with shortness of breath. History of COPD. EXAM: PORTABLE CHEST 1 VIEW COMPARISON:  Chest radiograph dated 11/05/2017 FINDINGS: There  is emphysematous changes of the lungs with interstitial coarsening and areas of scarring. No focal consolidation, pleural effusion, or pneumothorax. The cardiac silhouette is within normal limits no acute osseous pathology. IMPRESSION: Emphysema with chronic interstitial coarsening and scarring. No focal consolidation. Electronically Signed   By: Elgie Collard M.D.   On: 11/06/2017 06:04    Microbiology: Recent Results (from the past 240 hour(s))  Blood Culture (routine x 2)     Status: None (Preliminary result)   Collection Time: 11/06/17  6:05 AM  Result Value Ref Range Status   Specimen Description BLOOD ARM RIGHT UPPER  Final   Special Requests   Final    BOTTLES DRAWN AEROBIC AND ANAEROBIC Blood Culture adequate volume   Culture NO GROWTH 3 DAYS  Final   Report Status PENDING  Incomplete  Blood Culture (routine x 2)     Status: None (Preliminary result)   Collection Time: 11/06/17  6:13 AM  Result Value Ref Range Status   Specimen Description BLOOD RIGHT ARM  Final   Special Requests IN PEDIATRIC BOTTLE Blood Culture adequate volume  Final   Culture NO GROWTH 3 DAYS  Final   Report Status PENDING  Incomplete  MRSA PCR Screening  Status: None   Collection Time: 11/07/17 12:22 AM  Result Value Ref Range Status   MRSA by PCR NEGATIVE NEGATIVE Final    Comment:        The GeneXpert MRSA Assay (FDA approved for NASAL specimens only), is one component of a comprehensive MRSA colonization surveillance program. It is not intended to diagnose MRSA infection nor to guide or monitor treatment for MRSA infections.      Labs: Basic Metabolic Panel: Recent Labs  Lab 11/05/17 1614 11/06/17 0509 11/07/17 0256 11/08/17 0536 11/09/17 0553  NA 140 136 138 136 139  K 4.4 4.2 4.3 4.5 4.6  CL 105 103 100* 101 103  CO2 28 23 29 29 27   GLUCOSE 115* 129* 111* 115* 111*  BUN 15 15 21* 28* 24*  CREATININE 1.20 1.02 1.21 1.11 1.04  CALCIUM 9.3 8.8* 9.1 8.9 9.1  MG  --   --  2.0  2.1  --   PHOS  --   --  3.3  --   --    Liver Function Tests: No results for input(s): AST, ALT, ALKPHOS, BILITOT, PROT, ALBUMIN in the last 168 hours. No results for input(s): LIPASE, AMYLASE in the last 168 hours. No results for input(s): AMMONIA in the last 168 hours. CBC: Recent Labs  Lab 11/05/17 1614 11/06/17 0509 11/07/17 0256  WBC 15.1* 13.3* 17.4*  HGB 13.1 12.3* 12.9*  HCT 41.5 38.2* 40.8  MCV 69.1* 68.5* 69.0*  PLT 280 240 258   Cardiac Enzymes: No results for input(s): CKTOTAL, CKMB, CKMBINDEX, TROPONINI in the last 168 hours. BNP: BNP (last 3 results) Recent Labs    11/06/17 0526  BNP 57.5    ProBNP (last 3 results) No results for input(s): PROBNP in the last 8760 hours.  CBG: No results for input(s): GLUCAP in the last 168 hours.     Signed:  Zannie CovePreetha Ardythe Klute MD.  [facility] 11/10/2017, 2:54 PM

## 2017-11-10 NOTE — Progress Notes (Signed)
Kara DiesGeorge Cobb to be D/C'd Home per MD order.  Discussed with the patient and all questions fully answered.  VSS, Skin clean, dry and intact without evidence of skin break down, no evidence of skin tears noted. IV catheter discontinued intact. Site without signs and symptoms of complications. Dressing and pressure applied.  An After Visit Summary was printed and given to the patient. Patient received prescription.  D/c education completed with patient/family including follow up instructions, medication list, d/c activities limitations if indicated, with other d/c instructions as indicated by MD - patient able to verbalize understanding, all questions fully answered.   Patient instructed to return to ED, call 911, or call MD for any changes in condition.   Patient escorted via WC, and D/C home via private auto. Allergies as of 11/10/2017   No Known Allergies     Medication List    STOP taking these medications   levofloxacin 500 MG tablet Commonly known as:  LEVAQUIN     TAKE these medications   albuterol 108 (90 Base) MCG/ACT inhaler Commonly known as:  PROVENTIL HFA;VENTOLIN HFA Inhale 1-2 puffs into the lungs every 6 (six) hours as needed for wheezing or shortness of breath.   albuterol (2.5 MG/3ML) 0.083% nebulizer solution Commonly known as:  PROVENTIL Take 3 mLs (2.5 mg total) by nebulization every 6 (six) hours as needed for wheezing or shortness of breath.   benzonatate 100 MG capsule Commonly known as:  TESSALON Take 1 capsule (100 mg total) by mouth 3 (three) times daily as needed for cough.   guaiFENesin 600 MG 12 hr tablet Commonly known as:  MUCINEX Take 600 mg by mouth 2 (two) times daily as needed for cough.   ibuprofen 600 MG tablet Commonly known as:  ADVIL,MOTRIN Take 1 tablet (600 mg total) by mouth every 6 (six) hours as needed. What changed:  reasons to take this   nicotine 21 mg/24hr patch Commonly known as:  NICODERM CQ - dosed in mg/24 hours Place  1 patch (21 mg total) onto the skin daily.   predniSONE 20 MG tablet Commonly known as:  DELTASONE Take 1-2 tablets (20-40 mg total) by mouth daily with breakfast. Take 40mg  for 2days then 20mg  for 2days then 10mg  for 2days then STOP   SPIRIVA HANDIHALER 18 MCG inhalation capsule Generic drug:  tiotropium Place 18 mcg into inhaler and inhale daily.   SYMBICORT 160-4.5 MCG/ACT inhaler Generic drug:  budesonide-formoterol Inhale 2 puffs into the lungs 2 (two) times daily.       Casper HarrisonSamantha K Nysir Fergusson 11/10/2017 5:52 PM

## 2017-11-10 NOTE — Care Management Note (Addendum)
Case Management Note  Patient Details  Name: Devin Lopez MRN: [id number] Date of Birth: 02/15/1971  Subjective/Objective:  Admitted with acute hypoxic respiratory failure secondary to COPD exacerbation.               Action/Plan: Received discharge orders for today 11/10/2017; In to speak with patient, prior to admission patient lived at home with fiance.  Home Dme: nebulizer.  [place].  [facility]. Patient denies inability to afford medications or food.  No needs needed at this time.  Expected Discharge Date:  11/10/17               Expected Discharge Plan:  Home/Self Care  Discharge planning Services  CM Consult  Status of Service:  Completed, signed off  Yancey FlemingsKimberly R Vinaya Sancho, RN  Case Manager [phone] 11/10/2017, 4:11 PM

## 2017-11-10 NOTE — Plan of Care (Signed)
Patient remained compliant with care plan this shift. Patient maintained a positive cough but states this morning he "didn't feel as congested" A&OX4 independent with ADL's.

## 2017-11-11 LAB — CULTURE, BLOOD (ROUTINE X 2)
CULTURE: NO GROWTH
CULTURE: NO GROWTH
Special Requests: ADEQUATE
Special Requests: ADEQUATE

## 2017-11-13 MED FILL — $VENTOLIN HFA 18G INHALER: 108 (90 BAS | 75 days supply | Qty: 54 | Fill #2

## 2017-11-27 ENCOUNTER — Other Ambulatory Visit: Payer: Self-pay

## 2017-11-27 ENCOUNTER — Emergency Department (HOSPITAL_COMMUNITY): Payer: Self-pay

## 2017-11-27 ENCOUNTER — Emergency Department (HOSPITAL_COMMUNITY)
Admission: EM | Admit: 2017-11-27 | Discharge: 2017-11-28 | Disposition: A | Discharge status: Home / Self Care | Payer: Self-pay | Attending: Emergency Medicine | Admitting: Emergency Medicine

## 2017-11-27 ENCOUNTER — Encounter (HOSPITAL_COMMUNITY): Payer: Self-pay

## 2017-11-27 DIAGNOSIS — J441 Chronic obstructive pulmonary disease with (acute) exacerbation: Secondary | ICD-10-CM | POA: Insufficient documentation

## 2017-11-27 DIAGNOSIS — F17210 Nicotine dependence, cigarettes, uncomplicated: Secondary | ICD-10-CM | POA: Insufficient documentation

## 2017-11-27 DIAGNOSIS — I10 Essential (primary) hypertension: Secondary | ICD-10-CM | POA: Insufficient documentation

## 2017-11-27 DIAGNOSIS — Z79899 Other long term (current) drug therapy: Secondary | ICD-10-CM | POA: Insufficient documentation

## 2017-11-27 LAB — CBC
HCT: 36.9 % — ABNORMAL LOW (ref 39.0–52.0)
Hemoglobin: 12.2 g/dL — ABNORMAL LOW (ref 13.0–17.0)
MCH: 22.5 pg — AB (ref 26.0–34.0)
MCHC: 33.1 g/dL (ref 30.0–36.0)
MCV: 68.1 fL — ABNORMAL LOW (ref 78.0–100.0)
Platelets: 173 10*3/uL (ref 150–400)
RBC: 5.42 MIL/uL (ref 4.22–5.81)
RDW: 19.2 % — ABNORMAL HIGH (ref 11.5–15.5)
WBC: 14.9 10*3/uL — ABNORMAL HIGH (ref 4.0–10.5)

## 2017-11-27 LAB — BASIC METABOLIC PANEL
ANION GAP: 10 (ref 5–15)
BUN: 13 mg/dL (ref 6–20)
CALCIUM: 9 mg/dL (ref 8.9–10.3)
CO2: 24 mmol/L (ref 22–32)
CREATININE: 1.39 mg/dL — AB (ref 0.61–1.24)
Chloride: 100 mmol/L — ABNORMAL LOW (ref 101–111)
GFR, EST NON AFRICAN AMERICAN: 59 mL/min — AB (ref >60)
GLUCOSE: 115 mg/dL — AB (ref 65–99)
Potassium: 4.6 mmol/L (ref 3.5–5.1)
Sodium: 134 mmol/L — ABNORMAL LOW (ref 135–145)

## 2017-11-27 MED ORDER — METHYLPREDNISOLONE SODIUM SUCC 125 MG IJ SOLR
125.0000 mg | Freq: Once | INTRAMUSCULAR | Status: AC
  Administered 2017-11-27: 125 mg via INTRAVENOUS
  Filled 2017-11-27: qty 2

## 2017-11-27 MED ORDER — PREDNISONE 20 MG PO TABS: 40.0000 mg | ORAL_TABLET | Freq: Every day | ORAL | 0 refills | Status: AC

## 2017-11-27 MED ORDER — IPRATROPIUM-ALBUTEROL 0.5-2.5 (3) MG/3ML IN SOLN
3.0000 mL | Freq: Once | RESPIRATORY_TRACT | Status: AC
  Administered 2017-11-27: 3 mL via RESPIRATORY_TRACT
  Filled 2017-11-27: qty 3

## 2017-11-27 MED ORDER — ALBUTEROL SULFATE (2.5 MG/3ML) 0.083% IN NEBU
5.0000 mg | INHALATION_SOLUTION | Freq: Once | RESPIRATORY_TRACT | Status: DC
  Filled 2017-11-27: qty 6

## 2017-11-27 NOTE — ED Provider Notes (Signed)
[facility] Provider Note   CSN: [id number] Arrival date & time: 11/27/17  1744     History   Chief Complaint Chief Complaint  Patient presents with  . COPD    HPI Devin Lopez is a 47 y.o. male.  Patient with past medical history of COPD, pneumonia, and hypertension presents to the emergency department with a chief complaint of wheezing and shortness of breath.  He states that he has been outside working all day, and reports worsening of his COPD symptoms.  He reports associated wheezing and cough.  Denies any fevers chills.  He states that his chest feels tight, and this feels similar to prior COPD exacerbations.  He has received 1 breathing treatment in triage with minimal relief.  He reports that he has been taking his regular medications at home.  He denies any other associated symptoms.   The history is provided by the patient. No language interpreter was used.    Past Medical History:  Diagnosis Date  . COPD (chronic obstructive pulmonary disease) (HCC)   . Pneumonia     Patient Active Problem List   Diagnosis Date Noted  . Acute respiratory failure with hypercapnia (HCC)   . COPD exacerbation (HCC) 11/06/2017  . Acute respiratory distress 11/06/2017  . Tachycardia 11/06/2017  . Hypertension 11/06/2017  . Tobacco use 11/06/2017    Past Surgical History:  Procedure Laterality Date  . FINGER SURGERY         Home Medications    Prior to Admission medications   Medication Sig Start Date End Date Taking? Authorizing Provider  albuterol (PROVENTIL HFA;VENTOLIN HFA) 108 (90 Base) MCG/ACT inhaler Inhale 1-2 puffs into the lungs every 6 (six) hours as needed for wheezing or shortness of breath. 02/03/17   Quentin AngstJegede, Olugbemiga E, MD  albuterol (PROVENTIL) (2.5 MG/3ML) 0.083% nebulizer solution Take 3 mLs (2.5 mg total) by nebulization every 6 (six) hours as needed for wheezing or shortness of breath. 03/04/17   Raeford RazorKohut, Stephen, MD    benzonatate (TESSALON) 100 MG capsule Take 1 capsule (100 mg total) by mouth 3 (three) times daily as needed for cough. 11/10/17   Zannie CoveJoseph, Preetha, MD  guaiFENesin (MUCINEX) 600 MG 12 hr tablet Take 600 mg by mouth 2 (two) times daily as needed for cough.    [provider]  ibuprofen (ADVIL,MOTRIN) 600 MG tablet Take 1 tablet (600 mg total) by mouth every 6 (six) hours as needed. Patient taking differently: Take 600 mg by mouth every 6 (six) hours as needed for moderate pain.  02/04/17   Nira Connardama, Pedro Eduardo, MD  nicotine (NICODERM CQ - DOSED IN MG/24 HOURS) 21 mg/24hr patch Place 1 patch (21 mg total) onto the skin daily. Patient not taking: Reported on 11/05/2017 04/13/17   Corena HerterMumma, Shannon, MD  predniSONE (DELTASONE) 20 MG tablet Take 1-2 tablets (20-40 mg total) by mouth daily with breakfast. Take 40mg  for 2days then 20mg  for 2days then 10mg  for 2days then STOP 11/10/17   Zannie CoveJoseph, Preetha, MD  [facility] 18 MCG inhalation capsule Place 18 mcg into inhaler and inhale daily. 10/24/17   [provider]  SYMBICORT 160-4.5 MCG/ACT inhaler Inhale 2 puffs into the lungs 2 (two) times daily. 10/24/17   [provider]    Family History Family History  Problem Relation Age of Onset  . Diabetes Mother   . Aneurysm Mother   . Diabetes Maternal Grandmother     Social History Social History   Tobacco Use  .  Smoking status: Current Every Day Smoker    Packs/day: 0.00    Types: Cigarettes  . Smokeless tobacco: Never Used  Substance Use Topics  . Alcohol use: Yes  . Drug use: Yes    Types: Cocaine, Marijuana     Allergies   Patient has no known allergies.   Review of Systems Review of Systems  All other systems reviewed and are negative.    Physical Exam Updated Vital Signs BP 136/87   Pulse 81   Temp 98.6 F (37 C) (Oral)   Resp (!) 26   Wt 54 kg (119 lb)   SpO2 97%   BMI 19.80 kg/m   Physical Exam  Constitutional: He is oriented to person,  place, and time. He appears well-developed and well-nourished.  HENT:  Head: Normocephalic and atraumatic.  Eyes: Conjunctivae and EOM are normal. Pupils are equal, round, and reactive to light. Right eye exhibits no discharge. Left eye exhibits no discharge. No scleral icterus.  Neck: Normal range of motion. Neck supple. No JVD present.  Cardiovascular: Normal rate, regular rhythm and normal heart sounds. Exam reveals no gallop and no friction rub.  No murmur heard. Pulmonary/Chest: No respiratory distress. He has wheezes. He has no rales. He exhibits no tenderness.  Mildly increased work of breathing, speaks in complete sentences, wheezing throughout  Abdominal: Soft. He exhibits no distension and no mass. There is no tenderness. There is no rebound and no guarding.  Musculoskeletal: Normal range of motion. He exhibits no edema or tenderness.  Neurological: He is alert and oriented to person, place, and time.  Skin: Skin is warm and dry.  Psychiatric: He has a normal mood and affect. His behavior is normal. Judgment and thought content normal.  Nursing note and vitals reviewed.    ED Treatments / Results  Labs (all labs ordered are listed, but only abnormal results are displayed) Labs Reviewed  BASIC METABOLIC PANEL - Abnormal; Notable for the following components:      Result Value   Sodium 134 (*)    Chloride 100 (*)    Glucose, Bld 115 (*)    Creatinine, Ser 1.39 (*)    GFR calc non Af Amer 59 (*)    All other components within normal limits  CBC - Abnormal; Notable for the following components:   WBC 14.9 (*)    Hemoglobin 12.2 (*)    HCT 36.9 (*)    MCV 68.1 (*)    MCH 22.5 (*)    RDW 19.2 (*)    All other components within normal limits    EKG  EKG Interpretation None       Radiology No results found.  Procedures Procedures (including critical care time)  Medications Ordered in ED Medications  albuterol (PROVENTIL) (2.5 MG/3ML) 0.083% nebulizer solution  5 mg (not administered)  ipratropium-albuterol (DUONEB) 0.5-2.5 (3) MG/3ML nebulizer solution 3 mL (not administered)  methylPREDNISolone sodium succinate (SOLU-MEDROL) 125 mg/2 mL injection 125 mg (not administered)     Initial Impression / Assessment and Plan / ED Course  I have reviewed the triage vital signs and the nursing notes.  Pertinent labs & imaging results that were available during my care of the patient were reviewed by me and considered in my medical decision making (see chart for details).     Patient with bilateral wheezing.  He has a history of COPD.  He reports exacerbation started today secondary to the cold weather.  We will treat symptoms and check chest x-ray.  Will reassess.  Patient reassessed.  He feels significantly improved after 3 breathing treatments.  He ambulates without any shortness of breath.  His wheezing has improved.  He feels comfortable with discharge.  Will discharge with stress dose steroids.  PCP follow-up.  Final Clinical Impressions(s) / ED Diagnoses   Final diagnoses:  COPD exacerbation [facility])    ED Discharge Orders        Ordered    predniSONE (DELTASONE) 20 MG tablet  Daily     11/27/17 2340       Roxy Horseman, PA-C 11/27/17 2342    Phillis Haggis, MD 11/27/17 2350

## 2017-11-27 NOTE — ED Notes (Signed)
Patient transported to X-ray 

## 2017-11-27 NOTE — ED Notes (Addendum)
Patients O2 was at 97% while ambulating. Patient showed no sign of difficulty breathing and stated that he felt good. Rob, [place]PA. Has been notified.

## 2017-11-27 NOTE — ED Triage Notes (Signed)
Pt presents to the ed for copd exacerbation today.  PT reports doing his at home breathing treatments and not feeling better. Pt is obviously short of breath, denies any cp.

## 2017-11-28 MED FILL — predniSONE 10 MG TABS: 10 | 5 days supply | Qty: 10 | Fill #0

## 2017-11-30 ENCOUNTER — Emergency Department (HOSPITAL_COMMUNITY)
Admission: EM | Admit: 2017-11-30 | Discharge: 2017-11-30 | Disposition: A | Discharge status: Home / Self Care | Payer: Self-pay | Attending: Emergency Medicine | Admitting: Emergency Medicine

## 2017-11-30 ENCOUNTER — Other Ambulatory Visit: Payer: Self-pay

## 2017-11-30 ENCOUNTER — Encounter (HOSPITAL_COMMUNITY): Payer: Self-pay

## 2017-11-30 ENCOUNTER — Emergency Department (HOSPITAL_COMMUNITY): Payer: Self-pay

## 2017-11-30 DIAGNOSIS — J441 Chronic obstructive pulmonary disease with (acute) exacerbation: Secondary | ICD-10-CM

## 2017-11-30 DIAGNOSIS — R9431 Abnormal electrocardiogram [ECG] [EKG]: Secondary | ICD-10-CM

## 2017-11-30 DIAGNOSIS — Z79899 Other long term (current) drug therapy: Secondary | ICD-10-CM | POA: Insufficient documentation

## 2017-11-30 DIAGNOSIS — F17210 Nicotine dependence, cigarettes, uncomplicated: Secondary | ICD-10-CM | POA: Insufficient documentation

## 2017-11-30 DIAGNOSIS — I10 Essential (primary) hypertension: Secondary | ICD-10-CM | POA: Insufficient documentation

## 2017-11-30 LAB — CBC
HCT: 39.4 % (ref 39.0–52.0)
Hemoglobin: 13.4 g/dL (ref 13.0–17.0)
MCH: 23.5 pg — AB (ref 26.0–34.0)
MCHC: 34 g/dL (ref 30.0–36.0)
MCV: 69 fL — ABNORMAL LOW (ref 78.0–100.0)
PLATELETS: 217 10*3/uL (ref 150–400)
RBC: 5.71 MIL/uL (ref 4.22–5.81)
RDW: 20.2 % — ABNORMAL HIGH (ref 11.5–15.5)
WBC: 17.3 10*3/uL — ABNORMAL HIGH (ref 4.0–10.5)

## 2017-11-30 LAB — BASIC METABOLIC PANEL
Anion gap: 10 (ref 5–15)
BUN: 7 mg/dL (ref 6–20)
CALCIUM: 9 mg/dL (ref 8.9–10.3)
CO2: 24 mmol/L (ref 22–32)
CREATININE: 1.21 mg/dL (ref 0.61–1.24)
Chloride: 106 mmol/L (ref 101–111)
Glucose, Bld: 93 mg/dL (ref 65–99)
Potassium: 4.6 mmol/L (ref 3.5–5.1)
Sodium: 140 mmol/L (ref 135–145)

## 2017-11-30 MED ORDER — BENZONATATE 100 MG PO CAPS: 100.0000 mg | ORAL_CAPSULE | Freq: Three times a day (TID) | ORAL | 0 refills | Status: AC

## 2017-11-30 MED ORDER — ALBUTEROL SULFATE (2.5 MG/3ML) 0.083% IN NEBU
5.0000 mg | INHALATION_SOLUTION | Freq: Once | RESPIRATORY_TRACT | Status: AC
  Administered 2017-11-30: 5 mg via RESPIRATORY_TRACT
  Filled 2017-11-30: qty 6

## 2017-11-30 MED ORDER — ALBUTEROL (5 MG/ML) CONTINUOUS INHALATION SOLN
10.0000 mg/h | INHALATION_SOLUTION | RESPIRATORY_TRACT | Status: DC
  Administered 2017-11-30: 10 mg/h via RESPIRATORY_TRACT
  Filled 2017-11-30: qty 20

## 2017-11-30 MED ORDER — METHYLPREDNISOLONE SODIUM SUCC 125 MG IJ SOLR
125.0000 mg | Freq: Once | INTRAMUSCULAR | Status: AC
Start: 2017-11-30 — End: 2017-11-30
  Administered 2017-11-30: 125 mg via INTRAVENOUS
  Filled 2017-11-30: qty 2

## 2017-11-30 MED ORDER — IPRATROPIUM-ALBUTEROL 0.5-2.5 (3) MG/3ML IN SOLN: 3.0000 mL | Freq: Once | RESPIRATORY_TRACT | Status: DC

## 2017-11-30 MED ORDER — IPRATROPIUM BROMIDE 0.02 % IN SOLN
0.5000 mg | Freq: Once | RESPIRATORY_TRACT | Status: AC
  Administered 2017-11-30: 0.5 mg via RESPIRATORY_TRACT
  Filled 2017-11-30: qty 2.5

## 2017-11-30 MED ORDER — MAGNESIUM SULFATE 2 GM/50ML IV SOLN
2.0000 g | Freq: Once | INTRAVENOUS | Status: AC
  Administered 2017-11-30: 2 g via INTRAVENOUS
  Filled 2017-11-30: qty 50

## 2017-11-30 NOTE — ED Provider Notes (Signed)
[facility] Provider Note   CSN: [id number] Arrival date & time: 11/30/17  [id number]     History   Chief Complaint Chief Complaint  Patient presents with  . Shortness of Breath    HPI Kara DiesGeorge Stawicki is a 47 y.o. male with a history of COPD (gold class 4) who presents ED today for shortness of breath.  Patient was recently seen here 1/31 COPD exacerbation and discharged home with steroids.  He has been taking these as prescribed.  He notes that he awoke with increasing shortness of breath.  Patient has a chronic nonproductive cough that is unchanged.  He received a nebulizer treatment on arrival with minimal relief.  He has not taken his steroid dose this morning.  Patient denies any associated fever, chills, body aches, congestion, sore throat, chest pain, productive cough, hemoptysis, lower leg swelling.  Patient denies history of intubations for COPD.  He has been recently hospitalized on 11/06/2017 for COPD exacerbation where he was placed on BiPAP.  Patient has a current, lifetime smoker (30 pack year history).  He notes that he has baseline short of breath with any type of exertion.  Patient is on Symbicort and Spiriva daily. Patient is not on home O2.   HPI  Past Medical History:  Diagnosis Date  . COPD (chronic obstructive pulmonary disease) (HCC)   . Pneumonia     Patient Active Problem List   Diagnosis Date Noted  . Acute respiratory failure with hypercapnia (HCC)   . COPD exacerbation (HCC) 11/06/2017  . Acute respiratory distress 11/06/2017  . Tachycardia 11/06/2017  . Hypertension 11/06/2017  . Tobacco use 11/06/2017    Past Surgical History:  Procedure Laterality Date  . FINGER SURGERY         Home Medications    Prior to Admission medications   Medication Sig Start Date End Date Taking? Authorizing Provider  albuterol (PROVENTIL HFA;VENTOLIN HFA) 108 (90 Base) MCG/ACT inhaler Inhale 1-2 puffs into the lungs every 6 (six)  hours as needed for wheezing or shortness of breath. 02/03/17   Quentin AngstJegede, Olugbemiga E, MD  albuterol (PROVENTIL) (2.5 MG/3ML) 0.083% nebulizer solution Take 3 mLs (2.5 mg total) by nebulization every 6 (six) hours as needed for wheezing or shortness of breath. 03/04/17   Raeford RazorKohut, Stephen, MD  benzonatate (TESSALON) 100 MG capsule Take 1 capsule (100 mg total) by mouth 3 (three) times daily as needed for cough. 11/10/17   Zannie CoveJoseph, Preetha, MD  guaiFENesin (MUCINEX) 600 MG 12 hr tablet Take 600 mg by mouth 2 (two) times daily as needed for cough.    [provider]  ibuprofen (ADVIL,MOTRIN) 600 MG tablet Take 1 tablet (600 mg total) by mouth every 6 (six) hours as needed. Patient taking differently: Take 600 mg by mouth every 6 (six) hours as needed for moderate pain.  02/04/17   Nira Connardama, Pedro Eduardo, MD  nicotine (NICODERM CQ - DOSED IN MG/24 HOURS) 21 mg/24hr patch Place 1 patch (21 mg total) onto the skin daily. Patient not taking: Reported on 11/05/2017 04/13/17   Corena HerterMumma, Shannon, MD  predniSONE (DELTASONE) 20 MG tablet Take 2 tablets (40 mg total) by mouth daily. 11/27/17   Roxy HorsemanBrowning, Robert, PA-C  SPIRIVA HANDIHALER 18 MCG inhalation capsule Place 18 mcg into inhaler and inhale daily. 10/24/17   [provider]  SYMBICORT 160-4.5 MCG/ACT inhaler Inhale 2 puffs into the lungs 2 (two) times daily. 10/24/17   [provider]    Family History Family History  Problem Relation Age of Onset  . Diabetes Mother   . Aneurysm Mother   . Diabetes Maternal Grandmother     Social History Social History   Tobacco Use  . Smoking status: Current Every Day Smoker    Packs/day: 0.00    Types: Cigarettes  . Smokeless tobacco: Never Used  Substance Use Topics  . Alcohol use: Yes  . Drug use: Yes    Types: Cocaine, Marijuana     Allergies   Patient has no known allergies.   Review of Systems Review of Systems  All other systems reviewed and are negative.    Physical  Exam Updated Vital Signs BP (!) 154/112   Pulse (!) 107   Temp 98 F (36.7 C)   Resp (!) 24   SpO2 96%   Physical Exam  Constitutional: He appears well-developed and well-nourished.  HENT:  Head: Normocephalic and atraumatic.  Right Ear: Tympanic membrane and external ear normal.  Left Ear: Tympanic membrane and external ear normal.  Nose: Nose normal.  Mouth/Throat: Uvula is midline, oropharynx is clear and moist and mucous membranes are normal. No tonsillar exudate.  Eyes: Pupils are equal, round, and reactive to light. Right eye exhibits no discharge. Left eye exhibits no discharge. No scleral icterus.  Neck: Trachea normal. Neck supple. No spinous process tenderness present. No neck rigidity. Normal range of motion present.  No nuchal rigidity or meningismus  Cardiovascular: Normal rate, regular rhythm and intact distal pulses.  No murmur heard. Pulses:      Radial pulses are 2+ on the right side, and 2+ on the left side.       Dorsalis pedis pulses are 2+ on the right side, and 2+ on the left side.       Posterior tibial pulses are 2+ on the right side, and 2+ on the left side.  No lower extremity swelling or edema. Calves symmetric in size bilaterally.  Pulmonary/Chest: Accessory muscle usage present. Tachypnea noted. He has wheezes. He has rhonchi. He exhibits no tenderness.  Patient is sitting upright, able to speak in short sentences  Abdominal: Soft. Bowel sounds are normal. There is no tenderness. There is no rebound and no guarding.  Musculoskeletal: He exhibits no edema.  Lymphadenopathy:    He has no cervical adenopathy.  Neurological: He is alert.  Skin: Skin is warm and dry. No rash noted. He is not diaphoretic.  Psychiatric: He has a normal mood and affect.  Nursing note and vitals reviewed.    ED Treatments / Results  Labs (all labs ordered are listed, but only abnormal results are displayed) Labs Reviewed  CBC - Abnormal; Notable for the following  components:      Result Value   WBC 17.3 (*)    MCV 69.0 (*)    MCH 23.5 (*)    RDW 20.2 (*)    All other components within normal limits  BASIC METABOLIC PANEL    EKG  EKG Interpretation  Date/Time:  Sunday November 30 2017 09:44:51 EST Ventricular Rate:  115 PR Interval:  136 QRS Duration: 76 QT Interval:  458 QTC Calculation: 633 R Axis:   109 Text Interpretation:  Sinus tachycardia Right atrial enlargement Rightward axis Pulmonary disease pattern Septal infarct , age undetermined Prolonged QT --Increased vs comparison Abnormal ECG Confirmed by Rolland Porter ([id number]) on 11/30/2017 11:38:12 AM       Radiology Dg Chest 2 View  Result Date: 11/30/2017 CLINICAL DATA:  Shortness of breath, COPD EXAM: CHEST  2 VIEW COMPARISON:  Chest x-ray 11/26/2017, 11/05/2017 CT chest 06/06/2017 FINDINGS: The lungs are hyperinflated likely secondary to COPD. There is bilateral upper lobe interstitial and alveolar airspace disease likely reflecting chronic scarring when compared with multiple prior examinations. There is no pleural effusion or pneumothorax. The heart and mediastinal contours are unremarkable. The osseous structures are unremarkable. IMPRESSION: No active cardiopulmonary disease. COPD. Chronic bilateral upper lobe scarring similar in appearance to the prior exams. Electronically Signed   By: Elige Ko   On: 11/30/2017 11:00    Procedures Procedures (including critical care time)  Medications Ordered in ED Medications  methylPREDNISolone sodium succinate (SOLU-MEDROL) 125 mg/2 mL injection 125 mg (not administered)  albuterol (PROVENTIL,VENTOLIN) solution continuous neb (not administered)  ipratropium (ATROVENT) nebulizer solution 0.5 mg (not administered)  albuterol (PROVENTIL) (2.5 MG/3ML) 0.083% nebulizer solution 5 mg (5 mg Nebulization Given 11/30/17 0951)     Initial Impression / Assessment and Plan / ED Course  I have reviewed the triage vital signs and the nursing  notes.  Pertinent labs & imaging results that were available during my care of the patient were reviewed by me and considered in my medical decision making (see chart for details).     47 year old male with known COPD presenting with shortness of breath upon awakening this morning.  Patient denies change in cough or infectious symptoms.  On presentation the patient is noted to be tachypneic with increased work of breathing.  He is protecting his airway and does not appear to need BiPAP or intubation at this time.  He was given 1 dose of albuterol nebulizer in triage with minimal relief. He is requiring 4L of O2 to maintain oxygen saturations. He does not wear home O2. Will start on hour-long continuous nebulizer with Atrovent.  He did not take his home dose of steroids this morning.  Will give IV Solu-Medrol.  Labs, EKG and chest x-ray ordered in triage.  EKG shows prolonged QT of 633.  Will give IV magnesium for prolonged QT as well as additional benefit if bronchospasms are component of patient's shortness of breath.  Chest x-ray with chronic changes and no acute cardiopulmonary disease.  No infiltrates, consolidation, effusion, edema or cardiomegaly.  Labs reassuring.  Mild elevation of leukocytosis from prior.  After hour-long nebulizer patient feels breathing is back to normal.  He is no longer requiring supplemental O2.  No increased work of breathing, tachypnea or accessory muscle use.  Patient is without pursed lip breathing.  He ambulated in the department and oxygen saturations remained at above 94%.  Repeat EKG performed with resolution of prolonged QT with QTC of 438.  Patient is requesting discharge home.  Feel comfortable with this given patient is able to ambulate without hypoxia.  Patient states that he has albuterol inhaler as well as daily COPD medications at home.  Does not need refills.  He is on day 3 of 5 of steroid burst.  He is to continue this.  I advised the patient to follow-up  with PCP this week. Specific return precautions discussed. Time was given for all questions to be answered. The patient verbalized understanding and agreement with plan. The patient appears safe for discharge home.  Patient case discussed with Dr. Fayrene Fearing who is in agreement with plan.  Final Clinical Impressions(s) / ED Diagnoses   Final diagnoses:  COPD exacerbation (HCC)  Long QT interval    ED Discharge Orders    None       Scott Fix, Elmer Sow,  PA-C 11/30/17 1501    Rolland Porter, MD 12/01/17 2156

## 2017-11-30 NOTE — ED Notes (Signed)
Pt ambulated in hallway without difficulty. Oxygen saturations remained at 94% and above.

## 2017-11-30 NOTE — ED Triage Notes (Signed)
Patient complains of increased SOB related to COPD since awakening, coughing persistent through triage with wheezing and rhonchi, denies pain

## 2017-11-30 NOTE — Discharge Instructions (Signed)
You were seen here today for COPD exacerbation.  Please take daily COPD medications.  Use albuterol as needed for shortness of breath.  Please continue taking your steroids to completion. Take tessalon for cough. If you develop worsening or new concerning symptoms you can return to the emergency department for re-evaluation.

## 2018-11-23 IMAGING — DX DG CHEST 2V
4 series · 4 of 4 positions shown · non-contrast
Comparison: Chest x-ray 11/26/2017, 11/05/2017

CT chest 06/06/2017

CLINICAL DATA: Shortness of breath, COPD

EXAM:
CHEST  2 VIEW

[chest lat (1 of 2)]
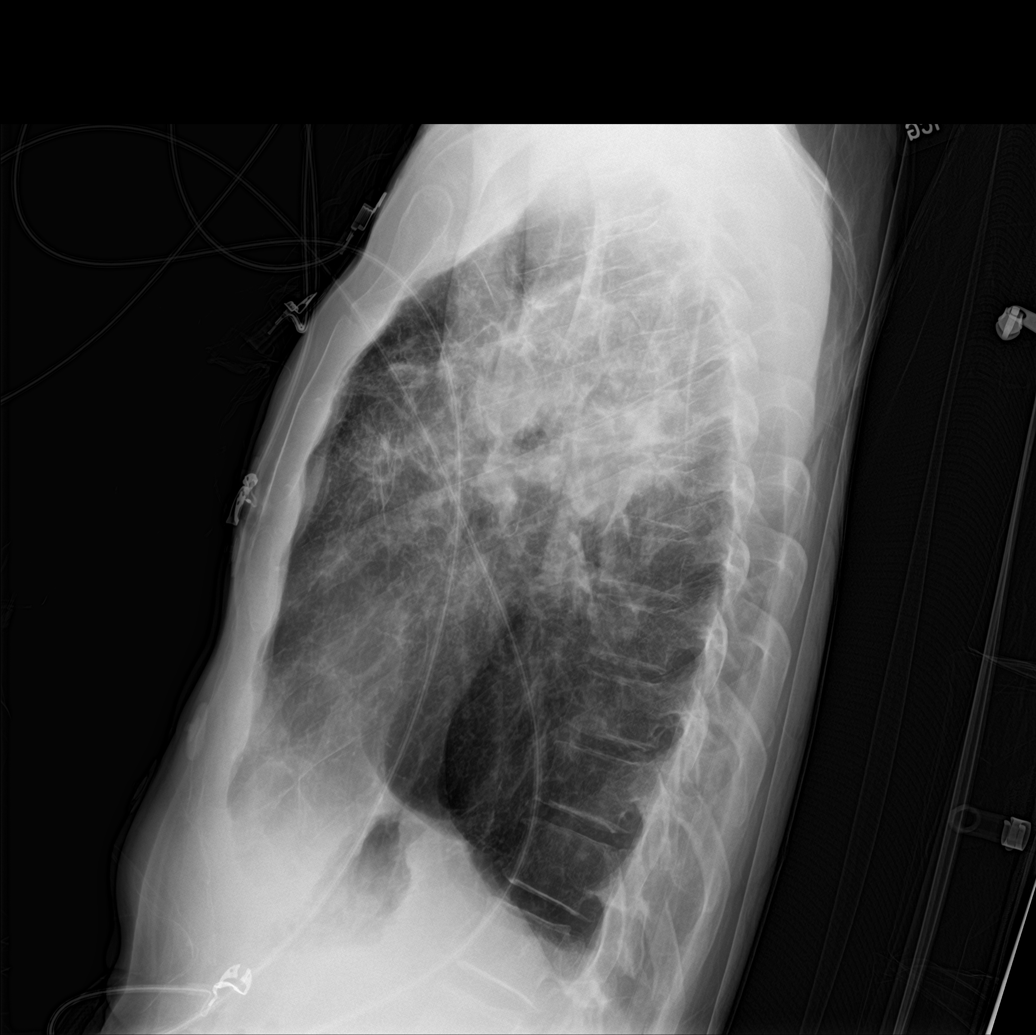

[chest ap (1 of 2)]
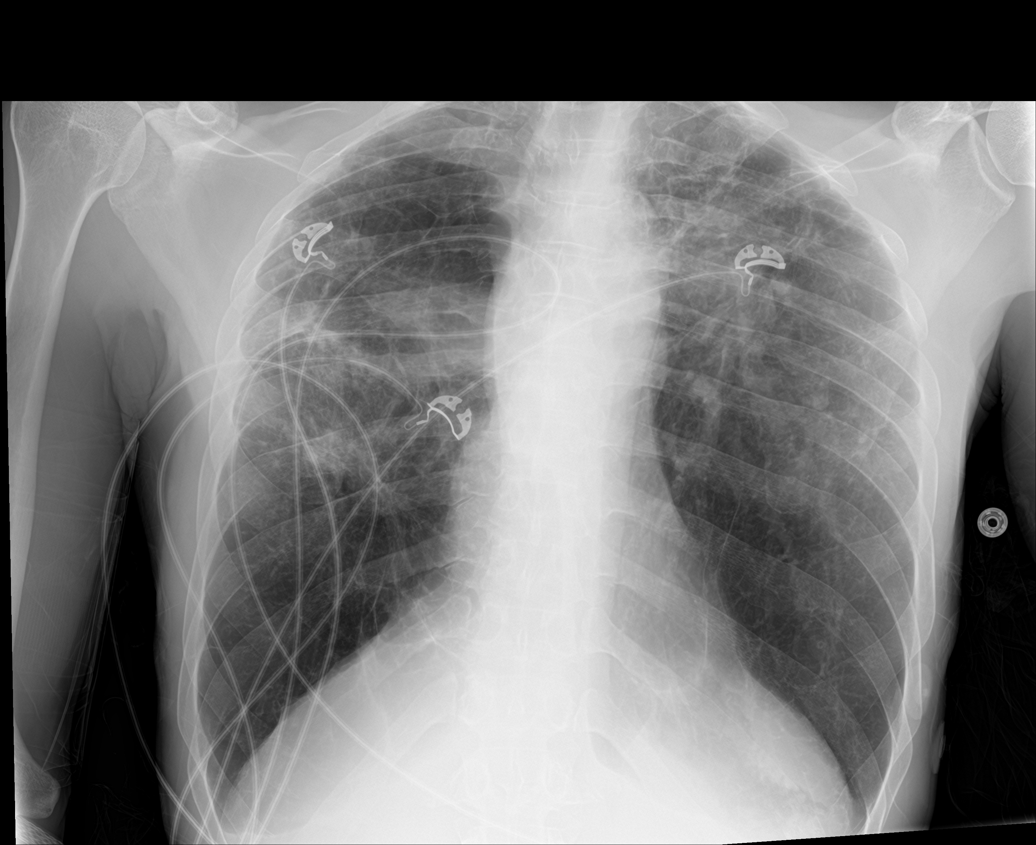

[chest ap (2 of 2)]
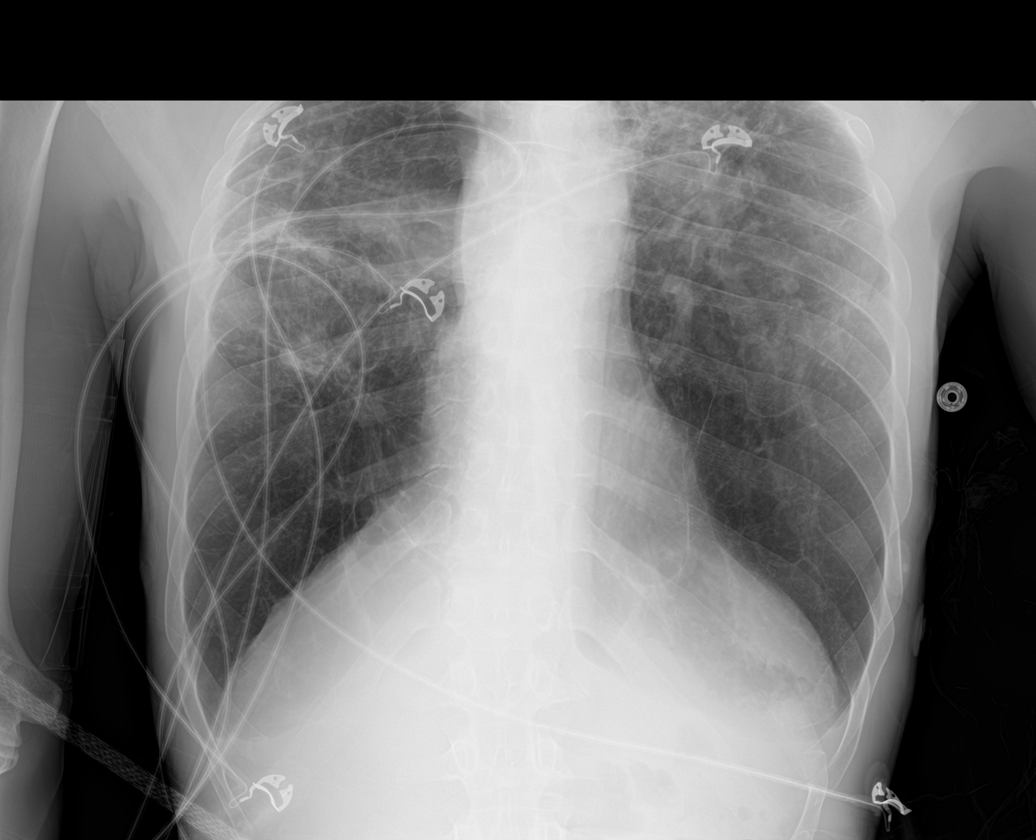

[chest lat (2 of 2)]
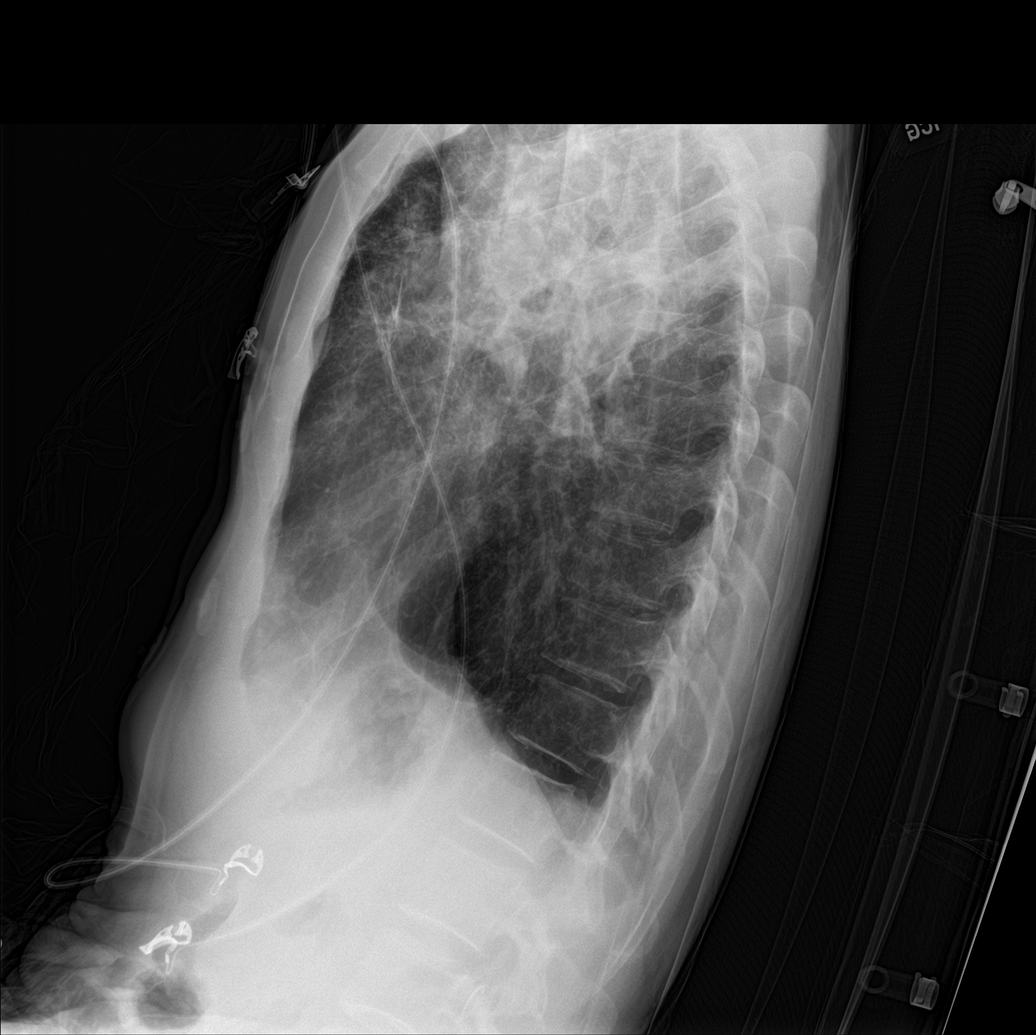

[4 of 4 positions shown; findings below may reference images not displayed]

FINDINGS: The lungs are hyperinflated likely secondary to COPD. There is
bilateral upper lobe interstitial and alveolar airspace disease
likely reflecting chronic scarring when compared with multiple prior
examinations. There is no pleural effusion or pneumothorax. The
heart and mediastinal contours are unremarkable.

The osseous structures are unremarkable.
IMPRESSION: No active cardiopulmonary disease.

COPD. Chronic bilateral upper lobe scarring similar in appearance to
the prior exams.

## 2022-03-22 ENCOUNTER — Telehealth: Payer: Self-pay | Admitting: Internal Medicine

## 2022-03-22 NOTE — Telephone Encounter (Signed)
Spoke with patient regarding the Palliative referral/services and he was in agreement with beginning services.  I have scheduled a Telehealth Consult for 04/01/22 @ 2 PM

## 2022-04-01 ENCOUNTER — Encounter: Payer: Self-pay | Admitting: Internal Medicine

## 2022-04-01 ENCOUNTER — Other Ambulatory Visit: Payer: Medicaid Other | Admitting: Internal Medicine

## 2022-04-01 VITALS — Wt 100.0 lb

## 2022-04-01 DIAGNOSIS — Z515 Encounter for palliative care: Secondary | ICD-10-CM

## 2022-04-01 DIAGNOSIS — Z720 Tobacco use: Secondary | ICD-10-CM

## 2022-04-01 DIAGNOSIS — R634 Abnormal weight loss: Secondary | ICD-10-CM

## 2022-04-01 DIAGNOSIS — J449 Chronic obstructive pulmonary disease, unspecified: Secondary | ICD-10-CM

## 2022-04-01 NOTE — Progress Notes (Signed)
Therapist, nutritional Palliative Care Consult Note Telephone: [phone]  Fax: [phone]   Date of encounter: 04/01/22 4:09 PM PATIENT NAME: Devin Lopez [address] [place] [address]   [phone] (home)  DOB: October 26, 1971 MRN: [id number]  REFERRING PROVIDER:   Larry Sierras, [facility] [address] [place],  [place] [address] [phone]  RESPONSIBLE PARTY:    Contact Information     Name Relation Home Work Mobile   [place] Daughter [phone]     [place] Other   [phone]        Due to the COVID-19 crisis, this visit was done via telemedicine from my office and it was initiated and consent by this patient and or family.  I connected with  Devin Lopez OR PROXY on 04/01/22 by a video enabled telemedicine application and verified that I am speaking with the correct person using two identifiers.   I discussed the limitations of evaluation and management by telemedicine. The patient expressed understanding and agreed to proceed. Zoom connection then failed to work properly.  Palliative Care was asked to follow this patient by consultation request of  Larry Sierras, F* to address advance care planning and complex medical decision making. This is the initial visit.                                     ASSESSMENT AND PLAN / RECOMMENDATIONS:   Advance Care Planning/Goals of Care: Goals include to maximize quality of life and symptom management. Patient/health care surrogate gave his/her permission to discuss.Our advance care planning conversation included a discussion about:    The value and importance of advance care planning  Experiences with loved ones who have been seriously ill or have died  Exploration of personal, cultural or spiritual beliefs that might influence medical decisions  Exploration of goals of care in the event of a sudden injury or illness  Identification  of a healthcare  agent  Review and updating or creation of an  advance directive document . Decision not to resuscitate or to de-escalate disease focused treatments due to poor prognosis. CODE STATUS:  full code at this point--patient is thinking about this and his perspective of being placed on a ventilator if needed.  Symptom Management/Plan: 1. COPD, very severe (HCC) -recommended he continue his current therapies including spiriva, symbicort, albuterol nebs and inhaler which he needs quite frequently -cont pulmonary rehab, as well -recommended completion of advance directives  2. Tobacco use -recommended smoking cessation--smokes part of a cigarette daily he says but does know this is still not good for him  3. Abnormal weight loss -ongoing and he says he's now down to 100 lbs -is eating fairly high protein from what he describes; recommended frequent small meals and snacks, discussed several high protein food selections -recommend that he see a dietitian to provide further guidance to help him maintain his weight  4. Palliative care by specialist -educated about importance of completing a living will and HCPOA--will provide form at home visit -also may complete MOST with him next time -recommended he think about his future wishes as COPD is a progressive illness  Follow up Palliative Care Visit: Palliative care will continue to follow for complex medical decision making, advance care planning, and clarification of goals. Return 05/09/2022 for in-home ACP.  This visit was coded based on time.  More than 50% spent  on counseling and coordination of care.  PPS: 50%  HOSPICE ELIGIBILITY/DIAGNOSIS: not at this time/COPD  Chief Complaint: initial palliative  HISTORY OF PRESENT ILLNESS:  Devin Lopez is a 51 y.o. year old male  with severe COPD, ongoing smoking, multiple pulmonary nodules.   He's having a good day today.  He has bad COPD--initially diagnosed 5-6 yrs ago.  He has pulmonary nodules.   He is way underweight.  He lost two lbs last month.  He's down to 100 lbs.  He eats well and taking boost.  He does fill up quickly.  Eats like a bird really.  He does not yet need oxygen.    Has symbicort, spiriva, and albuterol, also azithromycin three times weekly.   Uses rescue inhaler 3-4 times a day.  He tries to do pursed lip breathing.    His fiance comes and goes from the home helping him out.  He bathes and dresses on his own.  He mostly eats out.    He still smokes every now and then--a couple times per week maybe less than a cigarette.    He does not have a living will or HCPOA.  HIs daughter is the one who would make decisions for him.    He's had a couple of admissions to the hospital with his lungs especially when weather changes.  Two times end of April and into may.  He's been on CPAP, but has never required a ventilator.    History obtained from review of EMR, discussion with primary team, and interview with family, facility staff/caregiver and/or Devin Lopez.   I reviewed available labs, medications, imaging, studies and related documents from the EMR.  Records reviewed and summarized above.   ROS Review of Systems  Constitutional:  Positive for activity change, appetite change and fatigue. Negative for fever.  HENT:  Negative for congestion.   Respiratory:  Positive for cough, shortness of breath and wheezing.   Cardiovascular:  Negative for chest pain and palpitations.  Gastrointestinal:  Negative for constipation.  Genitourinary:  Negative for dysuria.  Musculoskeletal:  Negative for arthralgias.  Skin:  Negative for color change.  Neurological:  Positive for weakness. Negative for dizziness.  Psychiatric/Behavioral:  The patient is not nervous/anxious.    Physical Exam: Vitals:   04/01/22 1608  Weight: 100 lb (45.4 kg)   Body mass index is 16.64 kg/m. Wt Readings from Last 500 Encounters:  04/01/22 100 lb (45.4 kg)  11/27/17 119 lb (54 kg)  11/10/17 119  lb 11.4 oz (54.3 kg)  04/08/17 123 lb (55.8 kg)  03/04/17 130 lb (59 kg)  02/06/17 126 lb (57.2 kg)  02/01/17 140 lb (63.5 kg)  01/16/17 140 lb (63.5 kg)  01/12/17 130 lb (59 kg)  12/06/16 127 lb 9.6 oz (57.9 kg)  10/28/16 135 lb (61.2 kg)   Physical Exam zoom failed so could not complete beyond cachectic appearing male  CURRENT PROBLEM LIST:  Patient Active Problem List   Diagnosis Date Noted   Acute respiratory failure with hypercapnia (HCC)    COPD exacerbation (HCC) 11/06/2017   Acute respiratory distress 11/06/2017   Tachycardia 11/06/2017   Hypertension 11/06/2017   Tobacco use 11/06/2017   PAST MEDICAL HISTORY:  Active Ambulatory Problems    Diagnosis Date Noted   COPD exacerbation (HCC) 11/06/2017   Acute respiratory distress 11/06/2017   Tachycardia 11/06/2017   Hypertension 11/06/2017   Tobacco use 11/06/2017   Acute respiratory failure with hypercapnia (HCC)    Resolved Ambulatory  Problems    Diagnosis Date Noted   No Resolved Ambulatory Problems   Past Medical History:  Diagnosis Date   COPD (chronic obstructive pulmonary disease) (HCC)    Pneumonia    SOCIAL HX:  Social History   Tobacco Use   Smoking status: Every Day    Packs/day: 0.00    Types: Cigarettes   Smokeless tobacco: Never  Substance Use Topics   Alcohol use: Yes   FAMILY HX:  Family History  Problem Relation Age of Onset   Diabetes Mother    Aneurysm Mother    Diabetes Maternal Grandmother       ALLERGIES: No Known Allergies    PERTINENT MEDICATIONS:  Outpatient Encounter Medications as of 04/01/2022  Medication Sig   albuterol (PROVENTIL HFA;VENTOLIN HFA) 108 (90 Base) MCG/ACT inhaler Inhale 1-2 puffs into the lungs every 6 (six) hours as needed for wheezing or shortness of breath.   albuterol (PROVENTIL) (2.5 MG/3ML) 0.083% nebulizer solution Take 3 mLs (2.5 mg total) by nebulization every 6 (six) hours as needed for wheezing or shortness of breath.   benzonatate (TESSALON)  100 MG capsule Take 1 capsule (100 mg total) by mouth every 8 (eight) hours.   guaiFENesin (MUCINEX) 600 MG 12 hr tablet Take 600 mg by mouth 2 (two) times daily as needed for cough.   ibuprofen (ADVIL,MOTRIN) 600 MG tablet Take 1 tablet (600 mg total) by mouth every 6 (six) hours as needed. (Patient taking differently: Take 600 mg by mouth every 6 (six) hours as needed for moderate pain. )   nicotine (NICODERM CQ - DOSED IN MG/24 HOURS) 21 mg/24hr patch Place 1 patch (21 mg total) onto the skin daily. (Patient not taking: Reported on 11/05/2017)   predniSONE (DELTASONE) 20 MG tablet Take 2 tablets (40 mg total) by mouth daily. (Patient not taking: Reported on 11/30/2017)   SPIRIVA HANDIHALER 18 MCG inhalation capsule Place 18 mcg into inhaler and inhale daily.   SYMBICORT 160-4.5 MCG/ACT inhaler Inhale 2 puffs into the lungs 2 (two) times daily.   No facility-administered encounter medications on file as of 04/01/2022.    Thank you for the opportunity to participate in the care of Mr. Sharyl NimrodMeredith.  The palliative care team will continue to follow. Please call our office at [phone] if we can be of additional assistance.   Bufford Spikesiffany Jericha Bryden, DO  COVID-19 PATIENT SCREENING TOOL Asked and negative response unless otherwise noted:  Have you had symptoms of covid, tested positive or been in contact with someone with symptoms/positive test in the past 5-10 days?  NO

## 2022-05-09 ENCOUNTER — Other Ambulatory Visit: Payer: Medicaid Other | Admitting: Internal Medicine

## 2022-05-09 DIAGNOSIS — R634 Abnormal weight loss: Secondary | ICD-10-CM

## 2022-05-09 DIAGNOSIS — Z720 Tobacco use: Secondary | ICD-10-CM

## 2022-05-09 DIAGNOSIS — Z515 Encounter for palliative care: Secondary | ICD-10-CM

## 2022-05-09 DIAGNOSIS — J449 Chronic obstructive pulmonary disease, unspecified: Secondary | ICD-10-CM

## 2022-05-09 NOTE — Progress Notes (Signed)
[facility] Follow-Up Visit Telephone: [phone]  Fax: [phone]   Date of encounter: 05/17/22 12:25 PM PATIENT NAME: Devin Lopez Devin Lopez [address]   [phone] (home)  DOB: 03-Mar-1971 MRN: [id number] PRIMARY CARE PROVIDER:    Patient, No Pcp Per,  No address on file None  REFERRING PROVIDER:   No referring provider defined for this encounter. N/A  RESPONSIBLE PARTY:    Contact Information     Name Relation Home Work Mobile   Devin Lopez Daughter [phone]     Devin Lopez   [phone]        I met face to face with patient and family in his home. Palliative Care was asked to follow this patient by consultation request of  No ref. provider found to address advance care planning and complex medical decision making. This is follow-up visit.                                     ASSESSMENT AND PLAN / RECOMMENDATIONS:   Advance Care Planning/Goals of Care: Goals include to maximize quality of life and symptom management. Patient/health care surrogate gave his/her permission to discuss.Our advance care planning conversation included a discussion about:    The value and importance of advance care planning  Experiences with loved ones who have been seriously ill or have died  Exploration of personal, cultural or spiritual beliefs that might influence medical decisions  Exploration of goals of care in the event of a sudden injury or illness  Identification  of a healthcare agent  Review and updating or creation of an  advance directive document . Decision not to resuscitate or to de-escalate disease focused treatments due to poor prognosis. CODE STATUS:  Introduced MOST form to him.  Pt remains unsure if he would want to be put on a ventilator and would like to speak with his daughter Devin Lopez about this first before completing the form.  He said he'd fill it in and mail to me for  me to sign.  I also left him a living will and HCPOA blank form to be completed and notarized--also requested that I could upload this to his chart when It's complete.  His daughter will be his HCPOA he says.    Symptom Management/Plan: 1. COPD, very severe ([place]) -continue current nebs, inhalers, hs oxygen with CPAP  2. Tobacco use -emphasized importance of avoidance/cessation--discussed benefits to appetite, taste, and weight  3. Abnormal weight loss -recommend dietitian referral to help with food selections for weight gain, energy conservation with his COPD, discussed small frequent meals, high protein items with some examples provided -he's currently eating 2-3 times a day and 3 boosts per day -add remeron 7.93m po qhs for appetite  4. Palliative care by specialist -continue to follow over time and will upload acp docs to vynca/cone when completed  Follow up Palliative Care Visit: Palliative care will continue to follow for complex medical decision making, advance care planning, and clarification of goals. Return 08/08/2022 and prn.   This visit was coded based on medical decision making (MDM).20 mins spent on ACP.  PPS: 50%  HOSPICE ELIGIBILITY/DIAGNOSIS: TBD  Chief Complaint: Follow-up palliative visit  HISTORY OF PRESENT ILLNESS:  Devin Lopez a 51y.o. year old male  with severe COPD, osa, severe protein cal mal and tobacco abuse seen for palliative  care f/u at home.  He lives in an apt in [place] on the top floor.  He has not been going out with the air quality issues from [place] and humidity.  He did have to visit the ED 6/27 with increased sob and CPAP was needed.  He'd already done nebs 5x at home.  He was given solumedrol and more nebs and advised to fill the abx and prednisone taper that apparently his specialist had ordered already the prior day.  He did this and feels much better today.  Does get easily dyspneic with minimal exertion, prolonged  talking and adls.  He's eating 2-3 meals a day and 3 boost per day.  Weight remains a concern.  He has not done a living will/hcpoa.    History obtained from review of EMR, discussion with primary team, and interview with family, facility staff/caregiver and/or Devin Lopez.  I reviewed available labs, medications, imaging, studies and related documents from the EMR.  Records reviewed and summarized above.   ROS Review of Systems  Constitutional:  Positive for fatigue and unexpected weight change. Negative for activity change, appetite change, chills and fever.  HENT:  Negative for congestion and trouble swallowing.   Respiratory:  Positive for shortness of breath and wheezing. Negative for chest tightness.   Cardiovascular:  Negative for chest pain, palpitations and leg swelling.  Gastrointestinal:  Negative for abdominal pain.  Genitourinary:  Negative for dysuria.  Musculoskeletal:  Negative for back pain.  Skin:  Negative for color change.  Neurological:  Negative for dizziness and weakness.  Psychiatric/Behavioral:  Negative for confusion and sleep disturbance.     Physical Exam: There were no vitals filed for this visit. There is no height or weight on file to calculate BMI. Wt Readings from Last 500 Encounters:  04/01/22 100 lb (45.4 kg)  11/27/17 119 lb (54 kg)  11/10/17 119 lb 11.4 oz (54.3 kg)  04/08/17 123 lb (55.8 kg)  03/04/17 130 lb (59 kg)  02/06/17 126 lb (57.2 kg)  02/01/17 140 lb (63.5 kg)  01/16/17 140 lb (63.5 kg)  01/12/17 130 lb (59 kg)  12/06/16 127 lb 9.6 oz (57.9 kg)  10/28/16 135 lb (61.2 kg)   Physical Exam Vitals reviewed.  Constitutional:      Comments: Cachectic male not on oxygen  HENT:     Head: Normocephalic and atraumatic.     Right Ear: External ear normal.     Left Ear: External ear normal.     Nose: Nose normal.     Mouth/Throat:     Pharynx: Oropharynx is clear.  Eyes:     Conjunctiva/sclera: Conjunctivae normal.     Pupils: Pupils  are equal, round, and reactive to light.  Cardiovascular:     Rate and Rhythm: Regular rhythm. Tachycardia present.     Pulses: Normal pulses.     Heart sounds: Normal heart sounds.  Pulmonary:     Comments: Poor air movement, few wheezes Abdominal:     General: Bowel sounds are normal.     Palpations: Abdomen is soft.  Musculoskeletal:        General: Normal range of motion.     Right lower leg: No edema.     Left lower leg: No edema.  Skin:    General: Skin is warm and dry.  Neurological:     General: No focal deficit present.     Mental Status: He is alert and oriented to person, place, and time.  Psychiatric:  Mood and Affect: Mood normal.        Behavior: Behavior normal.     CURRENT PROBLEM LIST:  Patient Active Problem List   Diagnosis Date Noted   Acute respiratory failure with hypercapnia (HCC)    COPD exacerbation ([place]) 11/06/2017   Acute respiratory distress 11/06/2017   Tachycardia 11/06/2017   Hypertension 11/06/2017   Tobacco use 11/06/2017    PAST MEDICAL HISTORY:  Active Ambulatory Problems    Diagnosis Date Noted   COPD exacerbation ([place]) 11/06/2017   Acute respiratory distress 11/06/2017   Tachycardia 11/06/2017   Hypertension 11/06/2017   Tobacco use 11/06/2017   Acute respiratory failure with hypercapnia (HCC)    Resolved Ambulatory Problems    Diagnosis Date Noted   No Resolved Ambulatory Problems   Past Medical History:  Diagnosis Date   COPD (chronic obstructive pulmonary disease) (HCC)    Pneumonia     SOCIAL HX:  Social History   Tobacco Use   Smoking status: Every Day    Packs/day: 0.00    Types: Cigarettes   Smokeless tobacco: Never  Substance Use Topics   Alcohol use: Yes     ALLERGIES: No Known Allergies    PERTINENT MEDICATIONS:  Outpatient Encounter Medications as of 05/09/2022  Medication Sig   albuterol (PROVENTIL HFA;VENTOLIN HFA) 108 (90 Base) MCG/ACT inhaler Inhale 1-2 puffs into the lungs every 6 (six)  hours as needed for wheezing or shortness of breath.   albuterol (PROVENTIL) (2.5 MG/3ML) 0.083% nebulizer solution Take 3 mLs (2.5 mg total) by nebulization every 6 (six) hours as needed for wheezing or shortness of breath.   benzonatate (TESSALON) 100 MG capsule Take 1 capsule (100 mg total) by mouth every 8 (eight) hours.   guaiFENesin (MUCINEX) 600 MG 12 hr tablet Take 600 mg by mouth 2 (two) times daily as needed for cough.   ibuprofen (ADVIL,MOTRIN) 600 MG tablet Take 1 tablet (600 mg total) by mouth every 6 (six) hours as needed. (Patient taking differently: Take 600 mg by mouth every 6 (six) hours as needed for moderate pain. )   nicotine (NICODERM CQ - DOSED IN MG/24 HOURS) 21 mg/24hr patch Place 1 patch (21 mg total) onto the skin daily. (Patient not taking: Reported on 11/05/2017)   predniSONE (DELTASONE) 20 MG tablet Take 2 tablets (40 mg total) by mouth daily. (Patient not taking: Reported on 11/30/2017)   SPIRIVA HANDIHALER 18 MCG inhalation capsule Place 18 mcg into inhaler and inhale daily.   SYMBICORT 160-4.5 MCG/ACT inhaler Inhale 2 puffs into the lungs 2 (two) times daily.   No facility-administered encounter medications on file as of 05/09/2022.    Thank you for the opportunity to participate in the care of Mr. Wahlert.  The palliative care team will continue to follow. Please call our office at [phone] if we can be of additional assistance.   Hollace Kinnier, DO  COVID-19 PATIENT SCREENING TOOL Asked and negative response unless otherwise noted:  Have you had symptoms of covid, tested positive or been in contact with someone with symptoms/positive test in the past 5-10 days? No

## 2022-05-17 ENCOUNTER — Encounter: Payer: Self-pay | Admitting: Internal Medicine

## 2022-08-08 ENCOUNTER — Encounter: Payer: Medicaid Other | Admitting: Internal Medicine

## 2022-08-09 NOTE — Progress Notes (Signed)
This encounter was created in error - please disregard. Pt did not answer when I knocked on his door and his phone number was no longer correct when I called him.

## 2023-01-27 DEATH — deceased
